# Patient Record
Sex: Female | Born: 2000 | Race: White | Hispanic: No | Marital: Single | Smoking: Never smoker
Health system: Southern US, Community
[De-identification: ages and names within clinical notes are randomized; demographics above are authoritative.]

## PROBLEM LIST (undated history)

## (undated) ENCOUNTER — Ambulatory Visit: Admission: EM | Payer: Medicaid Other

## (undated) ENCOUNTER — Inpatient Hospital Stay: Payer: Self-pay

## (undated) DIAGNOSIS — K589 Irritable bowel syndrome without diarrhea: Secondary | ICD-10-CM

## (undated) DIAGNOSIS — A749 Chlamydial infection, unspecified: Secondary | ICD-10-CM

## (undated) DIAGNOSIS — N83209 Unspecified ovarian cyst, unspecified side: Secondary | ICD-10-CM

## (undated) HISTORY — PX: WISDOM TOOTH EXTRACTION: SHX21

## (undated) HISTORY — DX: Irritable bowel syndrome, unspecified: K58.9

## (undated) HISTORY — DX: Chlamydial infection, unspecified: A74.9

## (undated) HISTORY — DX: Unspecified ovarian cyst, unspecified side: N83.209

---
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
## 2018-01-14 ENCOUNTER — Encounter: Payer: Medicaid Other | Admitting: Maternal Newborn

## 2018-01-15 ENCOUNTER — Encounter: Payer: Medicaid Other | Admitting: Obstetrics and Gynecology

## 2018-01-22 ENCOUNTER — Ambulatory Visit (INDEPENDENT_AMBULATORY_CARE_PROVIDER_SITE_OTHER): Payer: Medicaid Other | Admitting: Advanced Practice Midwife

## 2018-01-22 ENCOUNTER — Encounter: Payer: Self-pay | Admitting: Advanced Practice Midwife

## 2018-01-22 VITALS — BP 110/60 | Wt 169.0 lb

## 2018-01-22 DIAGNOSIS — F418 Other specified anxiety disorders: Secondary | ICD-10-CM

## 2018-01-22 DIAGNOSIS — O99342 Other mental disorders complicating pregnancy, second trimester: Secondary | ICD-10-CM

## 2018-01-22 DIAGNOSIS — Z3A22 22 weeks gestation of pregnancy: Secondary | ICD-10-CM

## 2018-01-22 LAB — POCT URINALYSIS DIPSTICK OB
Glucose, UA: NEGATIVE
POC,PROTEIN,UA: NEGATIVE

## 2018-01-22 NOTE — Progress Notes (Signed)
ROB- no concerns 

## 2018-01-22 NOTE — Progress Notes (Signed)
Routine Prenatal Care Visit  Subjective  Janet Green is a 17 y.o. G1P0000 at [redacted]w[redacted]d being seen today for ongoing prenatal care.  She is currently monitored for the following issues for this low-risk pregnancy and has Amenorrhea following discontinuation of oral contraceptive use; Bacterial vaginosis; Left lower quadrant pain; Supervision of normal first pregnancy, antepartum; Depression affecting pregnancy; and Anxiety during pregnancy on their problem list.  ----------------------------------------------------------------------------------- Patient reports she stopped taking zoloft because she did not think it was helping her. She is feeling better as circumstances have changed. She is having pain in the skin of her legs and pain in her ribs with difficulty getting comfortable when sleeping. Recommended regular exercise, stretching, epsom salt baths, stay well hydrated.   Contractions: Not present. Vag. Bleeding: Scant.  Movement: Present. Denies leaking of fluid.  ----------------------------------------------------------------------------------- The following portions of the patient's history were reviewed and updated as appropriate: allergies, current medications, past family history, past medical history, past social history, past surgical history and problem list. Problem list updated.   Objective  Blood pressure 110/60, weight 169 lb (76.7 kg), last menstrual period 04/16/2017. Pregravid weight 140 lb (63.5 kg) Total Weight Gain 29 lb (13.2 kg) Urinalysis: Urine Protein Negative  Urine Glucose Negative  Fetal Status: Fetal Heart Rate (bpm): 158 Fundal Height: 23 cm Movement: Present     General:  Alert, oriented and cooperative. Patient is in no acute distress.  Skin: Skin is warm and dry. No rash noted.   Cardiovascular: Normal heart rate noted  Respiratory: Normal respiratory effort, no problems with respiration noted  Abdomen: Soft, gravid, appropriate for gestational age.  Pain/Pressure: Absent     Pelvic:  Cervical exam deferred        Extremities: Normal range of motion.  Edema: None  Mental Status: Normal mood and affect. Normal behavior. Normal judgment and thought content.   Assessment   17 y.o. G1P0000 at [redacted]w[redacted]d by  05/23/2018, by Ultrasound presenting for routine prenatal visit  Plan   Pregnancy #1 Problems (from 10/08/17 to present)    Problem Noted Resolved   Depression affecting pregnancy 12/18/2017 by Conard Novak, MD No   Anxiety during pregnancy 12/18/2017 by Conard Novak, MD No   Supervision of normal first pregnancy, antepartum 10/08/2017 by Conard Novak, MD No   Overview Addendum 12/12/2017  5:35 PM by Natale Milch, MD      [facility]inic [facility] Prenatal Labs  Dating 7 wk [place] Blood type: AB/Positive/-- (08/16 1212)   Genetic Screen  NIPS: normal XX   Antibody:Negative (08/16 1212)  Anatomic [place]  Rubella: 0.97 (08/16 1212) Nonimmune Varicella: Nonimmune  GTT Early:        28 wk:      RPR: Non Reactive (08/16 1212)   Rhogam  not applicable HBsAg: Negative (08/16 1212)   TDaP vaccine                       HIV: Non Reactive (08/16 1212)   Flu Shot  Declines                              GBS:   Contraception  Pap: TOO YOUNG  CBB     CS/VBAC  not applicable   Baby Food  Breast   Support Person    Patient identifies as white              Preterm labor symptoms  and general obstetric precautions including but not limited to vaginal bleeding, contractions, leaking of fluid and fetal movement were reviewed in detail with the patient.    Return in about 4 weeks (around 02/19/2018) for rob.  Tresea Mall, CNM 01/22/2018 3:51 PM

## 2018-02-17 ENCOUNTER — Ambulatory Visit (INDEPENDENT_AMBULATORY_CARE_PROVIDER_SITE_OTHER): Payer: Medicaid Other | Admitting: Obstetrics and Gynecology

## 2018-02-17 ENCOUNTER — Encounter: Payer: Self-pay | Admitting: Obstetrics and Gynecology

## 2018-02-17 VITALS — BP 112/70 | Wt 171.0 lb

## 2018-02-17 DIAGNOSIS — F329 Major depressive disorder, single episode, unspecified: Secondary | ICD-10-CM

## 2018-02-17 DIAGNOSIS — Z131 Encounter for screening for diabetes mellitus: Secondary | ICD-10-CM

## 2018-02-17 DIAGNOSIS — O99342 Other mental disorders complicating pregnancy, second trimester: Secondary | ICD-10-CM

## 2018-02-17 DIAGNOSIS — Z3400 Encounter for supervision of normal first pregnancy, unspecified trimester: Secondary | ICD-10-CM

## 2018-02-17 DIAGNOSIS — F32A Depression, unspecified: Secondary | ICD-10-CM

## 2018-02-17 DIAGNOSIS — O99340 Other mental disorders complicating pregnancy, unspecified trimester: Secondary | ICD-10-CM

## 2018-02-17 DIAGNOSIS — Z113 Encounter for screening for infections with a predominantly sexual mode of transmission: Secondary | ICD-10-CM

## 2018-02-17 DIAGNOSIS — Z3A26 26 weeks gestation of pregnancy: Secondary | ICD-10-CM

## 2018-02-17 DIAGNOSIS — F419 Anxiety disorder, unspecified: Secondary | ICD-10-CM

## 2018-02-17 NOTE — Progress Notes (Signed)
Routine Prenatal Care Visit  Subjective  Janet Green is a 17 y.o. G1P0000 at 6731w3d being seen today for ongoing prenatal care.  She is currently monitored for the following issues for this low-risk pregnancy and has Amenorrhea following discontinuation of oral contraceptive use; Bacterial vaginosis; Left lower quadrant pain; Supervision of normal first pregnancy, antepartum; Depression affecting pregnancy; and Anxiety during pregnancy on their problem list.  ----------------------------------------------------------------------------------- Patient reports no complaints.   Contractions: Not present. Vag. Bleeding: None.  Movement: Present. Denies leaking of fluid.  ----------------------------------------------------------------------------------- The following portions of the patient's history were reviewed and updated as appropriate: allergies, current medications, past family history, past medical history, past social history, past surgical history and problem list. Problem list updated.   Objective  Blood pressure 112/70, weight 171 lb (77.6 kg), last menstrual period 04/16/2017. Pregravid weight 140 lb (63.5 kg) Total Weight Gain 31 lb (14.1 kg) Urinalysis: Urine Protein    Urine Glucose    Fetal Status: Fetal Heart Rate (bpm): 145 Fundal Height: 26 cm Movement: Present     General:  Alert, oriented and cooperative. Patient is in no acute distress.  Skin: Skin is warm and dry. No rash noted.   Cardiovascular: Normal heart rate noted  Respiratory: Normal respiratory effort, no problems with respiration noted  Abdomen: Soft, gravid, appropriate for gestational age. Pain/Pressure: Absent     Pelvic:  Cervical exam deferred        Extremities: Normal range of motion.  Edema: None  Mental Status: Normal mood and affect. Normal behavior. Normal judgment and thought content.   Assessment   17 y.o. G1P0000 at 8531w3d by  05/23/2018, by Ultrasound presenting for routine prenatal  visit  Plan   Pregnancy #1 Problems (from 10/08/17 to present)    Problem Noted Resolved   Depression affecting pregnancy 12/18/2017 by Conard NovakJackson, Albertia Carvin D, MD No   Anxiety during pregnancy 12/18/2017 by Conard NovakJackson, Murline Weigel D, MD No   Supervision of normal first pregnancy, antepartum 10/08/2017 by Conard NovakJackson, Zykira Matlack D, MD No   Overview Addendum 12/12/2017  5:35 PM by Natale MilchSchuman, Christanna R, MD      [facility]inic [facility] Prenatal Labs  Dating 7 wk US Blood type: AB/Positive/-- (08/16 1212)   Genetic Screen  NIPS: normal XX   Antibody:Negative (08/16 1212)  Anatomic US  Rubella: 0.97 (08/16 1212) Nonimmune Varicella: Nonimmune  GTT Early:        28 wk:      RPR: Non Reactive (08/16 1212)   Rhogam  not applicable HBsAg: Negative (08/16 1212)   TDaP vaccine                       HIV: Non Reactive (08/16 1212)   Flu Shot  Declines                              GBS:   Contraception  Pap: TOO YOUNG  CBB     CS/VBAC  not applicable   Baby Food  Breast   Support Person    Patient identifies as white              Preterm labor symptoms and general obstetric precautions including but not limited to vaginal bleeding, contractions, leaking of fluid and fetal movement were reviewed in detail with the patient. Please refer to After Visit Summary for other counseling recommendations.   Return in about 2 weeks (around 03/03/2018) for 28 week labs,  routine prenatal.  Thomasene Mohair, MD, [name], [facility] 02/17/2018 4:52 PM

## 2018-03-04 ENCOUNTER — Ambulatory Visit (INDEPENDENT_AMBULATORY_CARE_PROVIDER_SITE_OTHER): Payer: Medicaid Other | Admitting: Obstetrics and Gynecology

## 2018-03-04 ENCOUNTER — Encounter: Payer: Self-pay | Admitting: Obstetrics and Gynecology

## 2018-03-04 ENCOUNTER — Other Ambulatory Visit: Payer: Medicaid Other

## 2018-03-04 VITALS — BP 110/68 | Wt 172.0 lb

## 2018-03-04 DIAGNOSIS — F419 Anxiety disorder, unspecified: Secondary | ICD-10-CM

## 2018-03-04 DIAGNOSIS — Z3400 Encounter for supervision of normal first pregnancy, unspecified trimester: Secondary | ICD-10-CM

## 2018-03-04 DIAGNOSIS — Z113 Encounter for screening for infections with a predominantly sexual mode of transmission: Secondary | ICD-10-CM

## 2018-03-04 DIAGNOSIS — O99340 Other mental disorders complicating pregnancy, unspecified trimester: Secondary | ICD-10-CM

## 2018-03-04 DIAGNOSIS — O99343 Other mental disorders complicating pregnancy, third trimester: Secondary | ICD-10-CM

## 2018-03-04 DIAGNOSIS — F329 Major depressive disorder, single episode, unspecified: Secondary | ICD-10-CM

## 2018-03-04 DIAGNOSIS — F32A Depression, unspecified: Secondary | ICD-10-CM

## 2018-03-04 DIAGNOSIS — Z3A28 28 weeks gestation of pregnancy: Secondary | ICD-10-CM

## 2018-03-04 DIAGNOSIS — Z131 Encounter for screening for diabetes mellitus: Secondary | ICD-10-CM

## 2018-03-04 NOTE — Progress Notes (Signed)
Routine Prenatal Care Visit  Subjective  Janet Green is a 17 y.o. G1P0000 at 425w4d being seen today for ongoing prenatal care.  She is currently monitored for the following issues for this low-risk pregnancy and has Amenorrhea following discontinuation of oral contraceptive use; Bacterial vaginosis; Left lower quadrant pain; Supervision of normal first pregnancy, antepartum; Depression affecting pregnancy; and Anxiety during pregnancy on their problem list.  ----------------------------------------------------------------------------------- Patient reports no complaints.   Contractions: Not present. Vag. Bleeding: None.  Movement: Present. Denies leaking of fluid.  ----------------------------------------------------------------------------------- The following portions of the patient's history were reviewed and updated as appropriate: allergies, current medications, past family history, past medical history, past social history, past surgical history and problem list. Problem list updated.   Objective  Blood pressure 110/68, weight 172 lb (78 kg), last menstrual period 04/16/2017. Pregravid weight 140 lb (63.5 kg) Total Weight Gain 32 lb (14.5 kg) Urinalysis: Urine Protein    Urine Glucose    Fetal Status: Fetal Heart Rate (bpm): 145 Fundal Height: 28 cm Movement: Present     General:  Alert, oriented and cooperative. Patient is in no acute distress.  Skin: Skin is warm and dry. No rash noted.   Cardiovascular: Normal heart rate noted  Respiratory: Normal respiratory effort, no problems with respiration noted  Abdomen: Soft, gravid, appropriate for gestational age. Pain/Pressure: Absent     Pelvic:  Cervical exam deferred        Extremities: Normal range of motion.  Edema: None  Mental Status: Normal mood and affect. Normal behavior. Normal judgment and thought content.   Assessment   17 y.o. G1P0000 at 795w4d by  05/23/2018, by Ultrasound presenting for routine prenatal visit  Plan    Pregnancy #1 Problems (from 10/08/17 to present)    Problem Noted Resolved   Depression affecting pregnancy 12/18/2017 by Conard NovakJackson, Karuna Balducci D, MD No   Anxiety during pregnancy 12/18/2017 by Conard NovakJackson, Ashly Goethe D, MD No   Supervision of normal first pregnancy, antepartum 10/08/2017 by Conard NovakJackson, Balen Woolum D, MD No   Overview Addendum 12/12/2017  5:35 PM by Natale MilchSchuman, Christanna R, MD      [facility]inic [facility] Prenatal Labs  Dating 7 wk US Blood type: AB/Positive/-- (08/16 1212)   Genetic Screen  NIPS: normal XX   Antibody:Negative (08/16 1212)  Anatomic US  Rubella: 0.97 (08/16 1212) Nonimmune Varicella: Nonimmune  GTT Early:        28 wk:      RPR: Non Reactive (08/16 1212)   Rhogam  not applicable HBsAg: Negative (08/16 1212)   TDaP vaccine                       HIV: Non Reactive (08/16 1212)   Flu Shot  Declines                              GBS:   Contraception  Pap: TOO YOUNG  CBB     CS/VBAC  not applicable   Baby Food  Breast   Support Person    Patient identifies as white              Preterm labor symptoms and general obstetric precautions including but not limited to vaginal bleeding, contractions, leaking of fluid and fetal movement were reviewed in detail with the patient. Please refer to After Visit Summary for other counseling recommendations.   - discussed sx of caval compression. Today she describes sx consistent  with caval compression. Instructions given to reduce and minimize symptoms and alleviate them when present. If these measures do not alleviate the sx, she is to contact us immediately for evaluation or go to the ER for syncope (which she did not report).   Return in about 2 weeks (around 03/18/2018) for Routine Prenatal Appointment.  Thomasene Mohair, MD, [name], [facility] 03/04/2018 9:05 AM

## 2018-03-05 LAB — 28 WEEK RH+PANEL
Basophils Absolute: 0.1 10*3/uL (ref 0.0–0.2)
Basos: 0 %
EOS (ABSOLUTE): 0 10*3/uL (ref 0.0–0.4)
Eos: 0 %
Gestational Diabetes Screen: 80 mg/dL (ref 65–139)
HIV Screen 4th Generation wRfx: NONREACTIVE
Hematocrit: 32.3 % — ABNORMAL LOW (ref 34.0–46.6)
Hemoglobin: 10.6 g/dL — ABNORMAL LOW (ref 11.1–15.9)
Immature Grans (Abs): 0.3 10*3/uL — ABNORMAL HIGH (ref 0.0–0.1)
Immature Granulocytes: 2 %
Lymphocytes Absolute: 1.7 10*3/uL (ref 0.7–3.1)
Lymphs: 11 %
MCH: 29.4 pg (ref 26.6–33.0)
MCHC: 32.8 g/dL (ref 31.5–35.7)
MCV: 90 fL (ref 79–97)
Monocytes Absolute: 1 10*3/uL — ABNORMAL HIGH (ref 0.1–0.9)
Monocytes: 7 %
Neutrophils Absolute: 12.2 10*3/uL — ABNORMAL HIGH (ref 1.4–7.0)
Neutrophils: 80 %
Platelets: 282 10*3/uL (ref 150–450)
RBC: 3.6 x10E6/uL — ABNORMAL LOW (ref 3.77–5.28)
RDW: 12.1 % — ABNORMAL LOW (ref 12.3–15.4)
RPR Ser Ql: NONREACTIVE
WBC: 15.3 10*3/uL — ABNORMAL HIGH (ref 3.4–10.8)

## 2018-03-16 ENCOUNTER — Telehealth: Payer: Self-pay

## 2018-03-16 NOTE — Telephone Encounter (Signed)
Pt called c/o getting dizzy, hot, feels like she's going to pass out.  Adv to move slower - stand up slower, sit down slower, do not lock knees while standing - there is more blood to be pumped around.  Also be sure to keep hydrated, eat protein.  Her appt has been moved up to Wed instead of Thurs.

## 2018-03-18 ENCOUNTER — Ambulatory Visit (INDEPENDENT_AMBULATORY_CARE_PROVIDER_SITE_OTHER): Payer: Medicaid Other | Admitting: Obstetrics and Gynecology

## 2018-03-18 ENCOUNTER — Encounter: Payer: Self-pay | Admitting: Obstetrics and Gynecology

## 2018-03-18 VITALS — BP 125/72 | Wt 173.0 lb

## 2018-03-18 DIAGNOSIS — O99343 Other mental disorders complicating pregnancy, third trimester: Secondary | ICD-10-CM

## 2018-03-18 DIAGNOSIS — F329 Major depressive disorder, single episode, unspecified: Secondary | ICD-10-CM

## 2018-03-18 DIAGNOSIS — F32A Depression, unspecified: Secondary | ICD-10-CM

## 2018-03-18 DIAGNOSIS — O99340 Other mental disorders complicating pregnancy, unspecified trimester: Secondary | ICD-10-CM

## 2018-03-18 DIAGNOSIS — Z3400 Encounter for supervision of normal first pregnancy, unspecified trimester: Secondary | ICD-10-CM

## 2018-03-18 DIAGNOSIS — F419 Anxiety disorder, unspecified: Secondary | ICD-10-CM

## 2018-03-18 DIAGNOSIS — Z3A30 30 weeks gestation of pregnancy: Secondary | ICD-10-CM

## 2018-03-18 LAB — POCT URINALYSIS DIPSTICK OB
Glucose, UA: NEGATIVE
POC,PROTEIN,UA: NEGATIVE

## 2018-03-18 NOTE — Progress Notes (Signed)
Routine Prenatal Care Visit  Subjective  Janet Green is a 17 y.o. G1P0000 at 709w4d being seen today for ongoing prenatal care.  She is currently monitored for the following issues for this low-risk pregnancy and has Amenorrhea following discontinuation of oral contraceptive use; Bacterial vaginosis; Left lower quadrant pain; Supervision of normal first pregnancy, antepartum; Depression affecting pregnancy; and Anxiety during pregnancy on their problem list.  ----------------------------------------------------------------------------------- Patient reports continues to have episodes of "blacking out" especially at work.  She states that after standing for some time she will feel like her vision fades and she feels hot and numb. She has tried position changes, but it still takes some time for her to feel better. She does not seem to have these symptoms at home. She did, however, have one episode where she was feeling fine while lying down. When she rolled over she started feeling bad again.  Once she changed her position back to the original, she felt much better.     Contractions: Not present. Vag. Bleeding: None.  Movement: Present. Denies leaking of fluid.  ----------------------------------------------------------------------------------- The following portions of the patient's history were reviewed and updated as appropriate: allergies, current medications, past family history, past medical history, past social history, past surgical history and problem list. Problem list updated.   Objective  Blood pressure 125/72, weight 173 lb (78.5 kg), last menstrual period 04/16/2017. Pregravid weight 140 lb (63.5 kg) Total Weight Gain 33 lb (15 kg) Urinalysis: Urine Protein Negative  Urine Glucose Negative  Fetal Status: Fetal Heart Rate (bpm): 145 Fundal Height: 29 cm Movement: Present     General:  Alert, oriented and cooperative. Patient is in no acute distress.  Skin: Skin is warm and dry. No  rash noted.   Cardiovascular: Normal heart rate noted  Respiratory: Normal respiratory effort, no problems with respiration noted  Abdomen: Soft, gravid, appropriate for gestational age. Pain/Pressure: Absent     Pelvic:  Cervical exam deferred        Extremities: Normal range of motion.  Edema: None  Mental Status: Normal mood and affect. Normal behavior. Normal judgment and thought content.   Assessment   17 y.o. G1P0000 at 589w4d by  05/23/2018, by Ultrasound presenting for routine prenatal visit  Plan   Pregnancy #1 Problems (from 10/08/17 to present)    Problem Noted Resolved   Depression affecting pregnancy 12/18/2017 by Conard NovakJackson, Jiovanny Burdell D, MD No   Anxiety during pregnancy 12/18/2017 by Conard NovakJackson, Kailo Kosik D, MD No   Supervision of normal first pregnancy, antepartum 10/08/2017 by Conard NovakJackson, Kamaal Cast D, MD No   Overview Addendum 12/12/2017  5:35 PM by Natale MilchSchuman, Christanna R, MD      [facility]inic [facility] Prenatal Labs  Dating 7 wk US Blood type: AB/Positive/-- (08/16 1212)   Genetic Screen  NIPS: normal XX   Antibody:Negative (08/16 1212)  Anatomic US  Rubella: 0.97 (08/16 1212) Nonimmune Varicella: Nonimmune  GTT Early:        28 wk:      RPR: Non Reactive (08/16 1212)   Rhogam  not applicable HBsAg: Negative (08/16 1212)   TDaP vaccine                       HIV: Non Reactive (08/16 1212)   Flu Shot  Declines                              GBS:   Contraception  Pap:  TOO YOUNG  CBB     CS/VBAC  not applicable   Baby Food  Breast   Support Person    Patient identifies as white           Preterm labor symptoms and general obstetric precautions including but not limited to vaginal bleeding, contractions, leaking of fluid and fetal movement were reviewed in detail with the patient. Please refer to After Visit Summary for other counseling recommendations.   - Discussed caval compression again. However, I urged her to go to the ER if this happens again because, based on how long her symptoms  last, I can't say for sure this is related to caval compression and she should be evaluated emergently while she has symptoms.  She did note vomiting one time when she tried to lie down in her car.  Note for work excuse today. She returns again to work on Sunday.  If she only has symptoms at work, she may need a not for a longer absence from work.   - Discussed TDaP. She needs to go to the [facility] to have her shot due to her age.   Return in about 2 weeks (around 04/01/2018) for Routine Prenatal Appointment.  Thomasene Mohair, MD, [name], [facility] 03/18/2018 12:27 PM

## 2018-03-19 ENCOUNTER — Encounter: Payer: Medicaid Other | Admitting: Obstetrics and Gynecology

## 2018-04-01 NOTE — L&D Delivery Note (Signed)
Delivery Note Primary OB: [facility] Delivery Physician: Annamarie Major, MD Gestational Age: Full term Antepartum complications: none Intrapartum complications: None  A viable Female was delivered via vertex perentation.  Apgars:9 ,9  Weight:  7 lb 12 oz .   Placenta status: spontaneous and Intact.  Cord: 3+ vessels;  with the following complications: none.  Anesthesia:  epidural Episiotomy:  none Lacerations:  periuretheral Suture Repair: 2.0 vicryl Est. Blood Loss (mL):  less than 100 mL  Mom to postpartum.  Baby to Couplet care / Skin to Skin.  Annamarie Major, MD, [name], [facility] 05/26/2018  6:00 PM [phone]

## 2018-04-02 ENCOUNTER — Encounter: Payer: Medicaid Other | Admitting: Obstetrics and Gynecology

## 2018-04-09 ENCOUNTER — Encounter: Payer: Self-pay | Admitting: Obstetrics and Gynecology

## 2018-04-09 ENCOUNTER — Ambulatory Visit (INDEPENDENT_AMBULATORY_CARE_PROVIDER_SITE_OTHER): Payer: Medicaid Other | Admitting: Obstetrics and Gynecology

## 2018-04-09 VITALS — BP 119/76 | HR 108 | Wt 176.0 lb

## 2018-04-09 DIAGNOSIS — O99340 Other mental disorders complicating pregnancy, unspecified trimester: Secondary | ICD-10-CM

## 2018-04-09 DIAGNOSIS — F32A Depression, unspecified: Secondary | ICD-10-CM

## 2018-04-09 DIAGNOSIS — F419 Anxiety disorder, unspecified: Secondary | ICD-10-CM

## 2018-04-09 DIAGNOSIS — Z3A33 33 weeks gestation of pregnancy: Secondary | ICD-10-CM

## 2018-04-09 DIAGNOSIS — Z3400 Encounter for supervision of normal first pregnancy, unspecified trimester: Secondary | ICD-10-CM

## 2018-04-09 DIAGNOSIS — F329 Major depressive disorder, single episode, unspecified: Secondary | ICD-10-CM

## 2018-04-09 DIAGNOSIS — O99343 Other mental disorders complicating pregnancy, third trimester: Secondary | ICD-10-CM

## 2018-04-09 LAB — POCT URINALYSIS DIPSTICK OB
Glucose, UA: NEGATIVE
POC,PROTEIN,UA: NEGATIVE

## 2018-04-09 NOTE — Progress Notes (Signed)
Routine Prenatal Care Visit  Subjective  Janet Green is a 18 y.o. G1P0000 at 2984w5d being seen today for ongoing prenatal care.  She is currently monitored for the following issues for this low-risk pregnancy and has Amenorrhea following discontinuation of oral contraceptive use; Bacterial vaginosis; Left lower quadrant pain; Supervision of normal first pregnancy, antepartum; Depression affecting pregnancy; and Anxiety during pregnancy on their problem list.  ----------------------------------------------------------------------------------- Patient reports no complaints.   Contractions: Irritability. Vag. Bleeding: None.  Movement: Present. Denies leaking of fluid.  ----------------------------------------------------------------------------------- The following portions of the patient's history were reviewed and updated as appropriate: allergies, current medications, past family history, past medical history, past social history, past surgical history and problem list. Problem list updated.   Objective  Blood pressure 119/76, pulse (!) 108, weight 176 lb (79.8 kg), last menstrual period 04/16/2017. Pregravid weight 140 lb (63.5 kg) Total Weight Gain 36 lb (16.3 kg) Urinalysis: Urine Protein Negative  Urine Glucose Negative  Fetal Status: Fetal Heart Rate (bpm): 145 Fundal Height: 33 cm Movement: Present     General:  Alert, oriented and cooperative. Patient is in no acute distress.  Skin: Skin is warm and dry. No rash noted.   Cardiovascular: Normal heart rate noted  Respiratory: Normal respiratory effort, no problems with respiration noted  Abdomen: Soft, gravid, appropriate for gestational age. Pain/Pressure: Absent     Pelvic:  Cervical exam deferred        Extremities: Normal range of motion.  Edema: None  Mental Status: Normal mood and affect. Normal behavior. Normal judgment and thought content.   Assessment   18 y.o. G1P0000 at 4584w5d by  05/23/2018, by Ultrasound presenting  for routine prenatal visit  Plan   Pregnancy #1 Problems (from 10/08/17 to present)    Problem Noted Resolved   Depression affecting pregnancy 12/18/2017 by Conard NovakJackson, Arlow Spiers D, MD No   Anxiety during pregnancy 12/18/2017 by Conard NovakJackson, Kourtnie Sachs D, MD No   Supervision of normal first pregnancy, antepartum 10/08/2017 by Conard NovakJackson, Czar Ysaguirre D, MD No   Overview Addendum 12/12/2017  5:35 PM by Natale MilchSchuman, Christanna R, MD      Clinic [facility] Prenatal Labs  Dating 7 wk US Blood type: AB/Positive/-- (08/16 1212)   Genetic Screen  NIPS: normal XX   Antibody:Negative (08/16 1212)  Anatomic US  Rubella: 0.97 (08/16 1212) Nonimmune Varicella: Nonimmune  GTT Early:        28 wk:      RPR: Non Reactive (08/16 1212)   Rhogam  not applicable HBsAg: Negative (08/16 1212)   TDaP vaccine                       HIV: Non Reactive (08/16 1212)   Flu Shot  Declines                              GBS:   Contraception  Pap: TOO YOUNG  CBB     CS/VBAC  not applicable   Baby Food  Breast   Support Person    Patient identifies as white              Preterm labor symptoms and general obstetric precautions including but not limited to vaginal bleeding, contractions, leaking of fluid and fetal movement were reviewed in detail with the patient. Please refer to After Visit Summary for other counseling recommendations.   Return in about 2 weeks (around 04/23/2018) for Routine Prenatal Appointment.  Thomasene MohairStephen Maitland Muhlbauer, MD, [facility], [facility] 04/09/2018 3:50 PM

## 2018-04-27 ENCOUNTER — Encounter: Payer: Self-pay | Admitting: Obstetrics and Gynecology

## 2018-04-27 ENCOUNTER — Ambulatory Visit (INDEPENDENT_AMBULATORY_CARE_PROVIDER_SITE_OTHER): Payer: Medicaid Other | Admitting: Obstetrics and Gynecology

## 2018-04-27 ENCOUNTER — Other Ambulatory Visit (HOSPITAL_COMMUNITY)
Admission: RE | Admit: 2018-04-27 | Discharge: 2018-04-27 | Disposition: A | Discharge status: Home / Self Care | Payer: Medicaid Other | Source: Ambulatory Visit | Attending: Obstetrics and Gynecology | Admitting: Obstetrics and Gynecology

## 2018-04-27 VITALS — BP 118/78 | Wt 178.0 lb

## 2018-04-27 DIAGNOSIS — Z3400 Encounter for supervision of normal first pregnancy, unspecified trimester: Secondary | ICD-10-CM | POA: Insufficient documentation

## 2018-04-27 DIAGNOSIS — O99343 Other mental disorders complicating pregnancy, third trimester: Secondary | ICD-10-CM

## 2018-04-27 DIAGNOSIS — F32A Depression, unspecified: Secondary | ICD-10-CM

## 2018-04-27 DIAGNOSIS — F419 Anxiety disorder, unspecified: Secondary | ICD-10-CM

## 2018-04-27 DIAGNOSIS — F329 Major depressive disorder, single episode, unspecified: Secondary | ICD-10-CM

## 2018-04-27 DIAGNOSIS — Z3A36 36 weeks gestation of pregnancy: Secondary | ICD-10-CM | POA: Diagnosis present

## 2018-04-27 DIAGNOSIS — O99340 Other mental disorders complicating pregnancy, unspecified trimester: Secondary | ICD-10-CM

## 2018-04-27 NOTE — Progress Notes (Signed)
Routine Prenatal Care Visit  Subjective  Janet Green is a 18 y.o. G1P0000 at 269w2d being seen today for ongoing prenatal care.  She is currently monitored for the following issues for this low-risk pregnancy and has Amenorrhea following discontinuation of oral contraceptive use; Bacterial vaginosis; Left lower quadrant pain; Supervision of normal first pregnancy, antepartum; Depression affecting pregnancy; and Anxiety during pregnancy on their problem list.  ----------------------------------------------------------------------------------- Patient reports no complaints.   Contractions: Not present. Vag. Bleeding: None.  Movement: Present. Denies leaking of fluid.  ----------------------------------------------------------------------------------- The following portions of the patient's history were reviewed and updated as appropriate: allergies, current medications, past family history, past medical history, past social history, past surgical history and problem list. Problem list updated.   Objective  Blood pressure 118/78, weight 178 lb (80.7 kg), last menstrual period 04/16/2017. Pregravid weight 140 lb (63.5 kg) Total Weight Gain 38 lb (17.2 kg) Urinalysis: Urine Protein    Urine Glucose    Fetal Status: Fetal Heart Rate (bpm): 135 Fundal Height: 35 cm Movement: Present     General:  Alert, oriented and cooperative. Patient is in no acute distress.  Skin: Skin is warm and dry. No rash noted.   Cardiovascular: Normal heart rate noted  Respiratory: Normal respiratory effort, no problems with respiration noted  Abdomen: Soft, gravid, appropriate for gestational age. Pain/Pressure: Absent     Pelvic:  Cervical exam deferred        Extremities: Normal range of motion.  Edema: None  Mental Status: Normal mood and affect. Normal behavior. Normal judgment and thought content.   Assessment   18 y.o. G1P0000 at 549w2d by  05/23/2018, by Ultrasound presenting for routine prenatal  visit  Plan   Pregnancy #1 Problems (from 10/08/17 to present)    Problem Noted Resolved   Depression affecting pregnancy 12/18/2017 by Conard NovakJackson, Vivienne Sangiovanni D, MD No   Anxiety during pregnancy 12/18/2017 by Conard NovakJackson, Stryker Veasey D, MD No   Supervision of normal first pregnancy, antepartum 10/08/2017 by Conard NovakJackson, Gaspard Isbell D, MD No   Overview Addendum 12/12/2017  5:35 PM by Natale MilchSchuman, Christanna R, MD      Clinic [facility] Prenatal Labs  Dating 7 wk US Blood type: AB/Positive/-- (08/16 1212)   Genetic Screen  NIPS: normal XX   Antibody:Negative (08/16 1212)  Anatomic US  Rubella: 0.97 (08/16 1212) Nonimmune Varicella: Nonimmune  GTT Early:        28 wk:      RPR: Non Reactive (08/16 1212)   Rhogam  not applicable HBsAg: Negative (08/16 1212)   TDaP vaccine                       HIV: Non Reactive (08/16 1212)   Flu Shot  Declines                              GBS:   Contraception  Pap: TOO YOUNG  CBB     CS/VBAC  not applicable   Baby Food  Breast   Support Person    Patient identifies as white              Preterm labor symptoms and general obstetric precautions including but not limited to vaginal bleeding, contractions, leaking of fluid and fetal movement were reviewed in detail with the patient. Please refer to After Visit Summary for other counseling recommendations.   -GBS/Aptima today  Return in about 1 week (around 05/04/2018).  Thomasene MohairStephen Maxim Bedel, MD, Merlinda FrederickFACOG [facility], [facility] 04/27/2018 11:13 AM

## 2018-04-28 LAB — CERVICOVAGINAL ANCILLARY ONLY
Chlamydia: NEGATIVE
Neisseria Gonorrhea: NEGATIVE

## 2018-04-29 LAB — STREP GP B NAA: Strep Gp B NAA: NEGATIVE

## 2018-05-03 ENCOUNTER — Observation Stay
Admission: EM | Admit: 2018-05-03 | Discharge: 2018-05-03 | Disposition: A | Discharge status: Home / Self Care | Payer: Medicaid Other | Attending: Obstetrics & Gynecology | Admitting: Obstetrics & Gynecology

## 2018-05-03 ENCOUNTER — Other Ambulatory Visit: Payer: Self-pay

## 2018-05-03 DIAGNOSIS — O26893 Other specified pregnancy related conditions, third trimester: Secondary | ICD-10-CM | POA: Diagnosis present

## 2018-05-03 DIAGNOSIS — M545 Low back pain, unspecified: Secondary | ICD-10-CM | POA: Diagnosis present

## 2018-05-03 DIAGNOSIS — Z3A37 37 weeks gestation of pregnancy: Secondary | ICD-10-CM | POA: Insufficient documentation

## 2018-05-03 DIAGNOSIS — R898 Other abnormal findings in specimens from other organs, systems and tissues: Secondary | ICD-10-CM

## 2018-05-03 DIAGNOSIS — O99343 Other mental disorders complicating pregnancy, third trimester: Secondary | ICD-10-CM | POA: Diagnosis not present

## 2018-05-03 DIAGNOSIS — F329 Major depressive disorder, single episode, unspecified: Secondary | ICD-10-CM | POA: Insufficient documentation

## 2018-05-03 LAB — URINALYSIS, COMPLETE (UACMP) WITH MICROSCOPIC
Bacteria, UA: NONE SEEN
Bilirubin Urine: NEGATIVE
Glucose, UA: NEGATIVE mg/dL
Hgb urine dipstick: NEGATIVE
Ketones, ur: NEGATIVE mg/dL
Leukocytes, UA: NEGATIVE
Nitrite: NEGATIVE
Protein, ur: NEGATIVE mg/dL
Specific Gravity, Urine: 1.014 (ref 1.005–1.030)
pH: 7 (ref 5.0–8.0)

## 2018-05-03 MED ORDER — LACTATED RINGERS IV SOLN: 500.0000 mL | INTRAVENOUS | Status: DC | PRN

## 2018-05-03 MED ORDER — ACETAMINOPHEN 325 MG PO TABS: 650.0000 mg | ORAL_TABLET | ORAL | Status: DC | PRN

## 2018-05-03 MED ORDER — OXYTOCIN BOLUS FROM INFUSION: 500.0000 mL | Freq: Once | INTRAVENOUS | Status: DC

## 2018-05-03 MED ORDER — LACTATED RINGERS IV SOLN: INTRAVENOUS | Status: DC

## 2018-05-03 MED ORDER — OXYTOCIN 40 UNITS IN NORMAL SALINE INFUSION - SIMPLE MED: 2.5000 [IU]/h | INTRAVENOUS | Status: DC

## 2018-05-03 MED ORDER — ONDANSETRON HCL 4 MG/2ML IJ SOLN: 4.0000 mg | Freq: Four times a day (QID) | INTRAMUSCULAR | Status: DC | PRN

## 2018-05-03 NOTE — Discharge Instructions (Signed)
Back Pain in Pregnancy  Back pain during pregnancy is common. Back pain may be caused by several factors that are related to changes during your pregnancy.  Follow these instructions at home:  Managing pain, stiffness, and swelling          If directed, for sudden (acute) back pain, put ice on the painful area.  ? Put ice in a plastic bag.  ? Place a towel between your skin and the bag.  ? Leave the ice on for 20 minutes, 2-3 times per day.   If directed, apply heat to the affected area before you exercise. Use the heat source that your health care provider recommends, such as a moist heat pack or a heating pad.  ? Place a towel between your skin and the heat source.  ? Leave the heat on for 20-30 minutes.  ? Remove the heat if your skin turns bright red. This is especially important if you are unable to feel pain, heat, or cold. You may have a greater risk of getting burned.   If directed, massage the affected area.  Activity   Exercise as told by your health care provider. Gentle exercise is the best way to prevent or manage back pain.   Listen to your body when lifting. If lifting hurts, ask for help or bend your knees. This uses your leg muscles instead of your back muscles.   Squat down when picking up something from the floor. Do not bend over.   Only use bed rest for short periods as told by your health care provider. Bed rest should only be used for the most severe episodes of back pain.  Standing, sitting, and lying down   Do not stand in one place for long periods of time.   Use good posture when sitting. Make sure your head rests over your shoulders and is not hanging forward. Use a pillow on your lower back if necessary.   Try sleeping on your side, preferably the left side, with a pregnancy support pillow or 1-2 regular pillows between your legs.  ? If you have back pain after a night's rest, your bed may be too soft.  ? A firm mattress may provide more support for your back during  pregnancy.  General instructions   Do not wear high heels.   Eat a healthy diet. Try to gain weight within your health care provider's recommendations.   Use a maternity girdle, elastic sling, or back brace as told by your health care provider.   Take over-the-counter and prescription medicines only as told by your health care provider.   Work with a physical therapist or massage therapist to find ways to manage back pain. Acupuncture or massage therapy may be helpful.   Keep all follow-up visits as told by your health care provider. This is important.  Contact a health care provider if:   Your back pain interferes with your daily activities.   You have increasing pain in other parts of your body.  Get help right away if:   You develop numbness, tingling, weakness, or problems with the use of your arms or legs.   You develop severe back pain that is not controlled with medicine.   You have a change in bowel or bladder control.   You develop shortness of breath, dizziness, or you faint.   You develop nausea, vomiting, or sweating.   You have back pain that is a rhythmic, cramping pain similar to labor pains. Labor   pain is usually 1-2 minutes apart, lasts for about 1 minute, and involves a bearing down feeling or pressure in your pelvis.   You have back pain and your water breaks or you have vaginal bleeding.   You have back pain or numbness that travels down your leg.   Your back pain developed after you fell.   You develop pain on one side of your back.   You see blood in your urine.   You develop skin blisters in the area of your back pain.  Summary   Back pain may be caused by several factors that are related to changes during your pregnancy.   Follow instructions as told by your health care provider for managing pain, stiffness, and swelling.   Exercise as told by your health care provider. Gentle exercise is the best way to prevent or manage back pain.   Take over-the-counter and  prescription medicines only as told by your health care provider.   Keep all follow-up visits as told by your health care provider. This is important.  This information is not intended to replace advice given to you by your health care provider. Make sure you discuss any questions you have with your health care provider.  Document Released: 06/26/2005 Document Revised: 09/03/2017 Document Reviewed: 09/03/2017  Elsevier Interactive Patient Education  2019 [employer]

## 2018-05-03 NOTE — Final Progress Note (Signed)
Physician Final Progress Note  Patient ID: Janet Green MRN: [id number] DOB/AGE: 2000/04/27 18 y.o.  Admit date: 05/03/2018 Admitting provider: Nadara Mustard, MD Discharge date: 05/03/2018  Admission Diagnoses: Low Back Pain 20 weeks Pregnancy Leakage of fluid  Discharge Diagnoses:  Principal Problem:   Low back pain   20 weeks pregnancy   Leakage of fluid  Consults: None  Significant Findings/ Diagnostic Studies:  Obstetrics Admission History & Physical   CC: Low back pain and leakage of fluid  HPI:  18 y.o. G1P0000 @ [redacted]w[redacted]d (05/23/2018, by Ultrasound). Admitted on 05/03/2018:   Patient Active Problem List   Diagnosis Date Noted  . Low back pain 05/03/2018  . 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 Anxiety during pregnancy 12/18/2017  . Supervision of normal first pregnancy, antepartum 10/08/2017  . Left lower quadrant pain 09/22/2017  . Bacterial vaginosis 03/06/2017  . Amenorrhea following discontinuation of oral contraceptive use 02/06/2017     Presents for leakage of fluid along with low back pain.  Leakage noted at 1200 today, continuous, clear, odorless.   Prenatal care at: at [facility]. Pregnancy complicated by none.  ROS: A review of systems was performed and negative, except as stated in the above HPI. PMHx:  Past Medical History:  Diagnosis Date  . IBS (irritable bowel syndrome)   . 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 Ruptured ovarian cyst 2019   PSHx:  Past Surgical History:  Procedure Laterality Date  . WISDOM TOOTH EXTRACTION     Medications:  Medications Prior to Admission  Medication Sig Dispense Refill Last Dose  . sertraline (ZOLOFT) 50 MG tablet Take 1 tablet (50 mg total) by mouth daily. 30 tablet 0 05/02/2018   Allergies: has No Known Allergies. OBHx:  OB History  Gravida Para Term Preterm AB Living  1 0 0 0 0 0  SAB TAB Ectopic Multiple Live Births  0 0 0 0 0    # Outcome Date GA Lbr Len/2nd Weight Sex Delivery Anes PTL Lv  1  Current            DVV:OHYWVPXT/GGYIRSWNIOEV except as detailed in HPI.Marland Kitchen  No family history of birth defects. Soc Hx: Alcohol: none and Recreational drug use: none  Objective:   Vitals:   05/03/18 1349  BP: 106/71  Resp: 20  Temp: 97.8 F (36.6 C)   Constitutional: Well nourished, well developed female in no acute distress.  HEENT: normal Skin: Warm and dry.  Cardiovascular:Regular rate and rhythm.   Extremity: trace to 1+ bilateral pedal edema Respiratory: Clear to auscultation bilateral. Normal respiratory effort Abdomen: gravid NT ND Back: no CVAT Neuro: DTRs 2+, Cranial nerves grossly intact Psych: Alert and Oriented x3. No memory deficits. Normal mood and affect.  MS: normal gait, normal bilateral lower extremity ROM/strength/stability.  Pelvic exam: is not limited by body habitus EGBUS: within normal limits Vagina: within normal limits and with normal mucosa and no blood or fluid in the vault Cervix: closed Uterus: No contractions observed for 30 minutes.  Adnexa: not evaluated  EFM:FHR: 140 bpm, variability: moderate,  accelerations:  Present,  decelerations:  Absent Toco: None  MICRO- FERN NEG  Perinatal info:  Blood type: AB positive Rubella- Not immune Varicella -Not immune TDaP tetanus re-vaccination not indicated RPR NR / HIV Neg/ HBsAg Neg   Assessment & Plan:   18 y.o. G1P0000 @ [redacted]w[redacted]d, Admitted on 05/03/2018:   Low Back Pain. No s/sx PROM, or labor Monitor sx's, follow up office tomorrow Heat for low  back pain  Procedures: A NST procedure was performed with FHR monitoring and a normal baseline established, appropriate time of 20-40 minutes of evaluation, and accels >2 seen w 15x15 characteristics.  Results show a REACTIVE NST.   Discharge Condition: good  Disposition: Discharge disposition: 01-Home or Self Care       Diet: Regular diet  Discharge Activity: Activity as tolerated  Discharge Instructions    Call MD for:   Complete by:  As  directed    Worsening contractions or pain; leakage of fluid; bleeding.   Diet general   Complete by:  As directed    Increase activity slowly   Complete by:  As directed      Allergies as of 05/03/2018   No Known Allergies     Medication List    TAKE these medications   sertraline 50 MG tablet Commonly known as:  ZOLOFT Take 1 tablet (50 mg total) by mouth daily.      Follow-up Information    Nadara MustardHarris, Zyrah Wiswell P, MD Follow up.   Specialty:  Obstetrics and Gynecology Why:  Prenatal follow up visit Contact information: [address] [place] [id number] [phone]           Total time spent taking care of this patient: 15 minutes  Signed: Letitia Libraobert Paul Jahi Roza 05/03/2018, 2:15 PM

## 2018-05-03 NOTE — Discharge Summary (Signed)
  See FPN 

## 2018-05-03 NOTE — OB Triage Note (Signed)
Pt reports a gush of fluid at noon today and it continues to leak.

## 2018-05-06 ENCOUNTER — Encounter: Payer: Self-pay | Admitting: Obstetrics and Gynecology

## 2018-05-06 ENCOUNTER — Ambulatory Visit (INDEPENDENT_AMBULATORY_CARE_PROVIDER_SITE_OTHER): Payer: Medicaid Other | Admitting: Obstetrics and Gynecology

## 2018-05-06 VITALS — BP 118/76 | Wt 176.0 lb

## 2018-05-06 DIAGNOSIS — Z3400 Encounter for supervision of normal first pregnancy, unspecified trimester: Secondary | ICD-10-CM

## 2018-05-06 DIAGNOSIS — O99340 Other mental disorders complicating pregnancy, unspecified trimester: Secondary | ICD-10-CM

## 2018-05-06 DIAGNOSIS — F32A Depression, unspecified: Secondary | ICD-10-CM

## 2018-05-06 DIAGNOSIS — Z3A37 37 weeks gestation of pregnancy: Secondary | ICD-10-CM

## 2018-05-06 DIAGNOSIS — F419 Anxiety disorder, unspecified: Secondary | ICD-10-CM

## 2018-05-06 DIAGNOSIS — F329 Major depressive disorder, single episode, unspecified: Secondary | ICD-10-CM

## 2018-05-06 DIAGNOSIS — O99343 Other mental disorders complicating pregnancy, third trimester: Secondary | ICD-10-CM

## 2018-05-06 NOTE — Progress Notes (Signed)
Routine Prenatal Care Visit  Subjective  Janet Green is a 18 y.o. G1P0000 at [redacted]w[redacted]d being seen today for ongoing prenatal care.  She is currently monitored for the following issues for this low-risk pregnancy and has Amenorrhea following discontinuation of oral contraceptive use; Bacterial vaginosis; Left lower quadrant pain; Supervision of normal first pregnancy, antepartum; Depression affecting pregnancy; Anxiety during pregnancy; and Low back pain on their problem list.  ----------------------------------------------------------------------------------- Patient reports no complaints.   Contractions: Not present. Vag. Bleeding: None.  Movement: Present. Denies leaking of fluid.  Went to hospital over weekend because she thought she had ROM. However, workup was negative and she has had no continued leakage. ----------------------------------------------------------------------------------- The following portions of the patient's history were reviewed and updated as appropriate: allergies, current medications, past family history, past medical history, past social history, past surgical history and problem list. Problem list updated.   Objective  Blood pressure 118/76, weight 176 lb (79.8 kg), last menstrual period 04/16/2017. Pregravid weight 140 lb (63.5 kg) Total Weight Gain 36 lb (16.3 kg) Urinalysis: Urine Protein    Urine Glucose    Fetal Status: Fetal Heart Rate (bpm): 145 Fundal Height: 38 cm Movement: Present  Presentation: Vertex  General:  Alert, oriented and cooperative. Patient is in no acute distress.  Skin: Skin is warm and dry. No rash noted.   Cardiovascular: Normal heart rate noted  Respiratory: Normal respiratory effort, no problems with respiration noted  Abdomen: Soft, gravid, appropriate for gestational age. Pain/Pressure: Absent     Pelvic:  Cervical exam deferred        Extremities: Normal range of motion.  Edema: None  Mental Status: Normal mood and affect.  Normal behavior. Normal judgment and thought content.   BSUS today: SLIUP in cephalic presentation, MVP 2.8 cm.  Assessment   18 y.o. G1P0000 at [redacted]w[redacted]d by  05/23/2018, by Ultrasound presenting for routine prenatal visit  Plan   Pregnancy #1 Problems (from 10/08/17 to present)    Problem Noted Resolved   Depression affecting pregnancy 12/18/2017 by Conard Novak, MD No   Anxiety during pregnancy 12/18/2017 by Conard Novak, MD No   Supervision of normal first pregnancy, antepartum 10/08/2017 by Conard Novak, MD No   Overview Addendum 12/12/2017  5:35 PM by Natale Milch, MD      Clinic [facility] Prenatal Labs  Dating 7 wk [place] Blood type: AB/Positive/-- (08/16 1212)   Genetic Screen  NIPS: normal XX   Antibody:Negative (08/16 1212)  Anatomic [place]  Rubella: 0.97 (08/16 1212) Nonimmune Varicella: Nonimmune  GTT Early:        28 wk:      RPR: Non Reactive (08/16 1212)   Rhogam  not applicable HBsAg: Negative (08/16 1212)   TDaP vaccine                       HIV: Non Reactive (08/16 1212)   Flu Shot  Declines                              GBS:   Contraception  Pap: TOO YOUNG  CBB     CS/VBAC  not applicable   Baby Food  Breast   Support Person    Patient identifies as white              Term labor symptoms and general obstetric precautions including but not limited to vaginal bleeding, contractions, leaking  of fluid and fetal movement were reviewed in detail with the patient. Please refer to After Visit Summary for other counseling recommendations.   - AFI next week given borderline amount on exam today.   Return in about 1 week (around 05/13/2018) for u/s for AFI and Routine Prenatal Appointment.  Thomasene MohairStephen Eryka Dolinger, MD, Merlinda FrederickFACOG [facility], [facility] 05/06/2018 1:09 PM

## 2018-05-15 ENCOUNTER — Encounter: Payer: Medicaid Other | Admitting: Obstetrics and Gynecology

## 2018-05-15 ENCOUNTER — Ambulatory Visit (INDEPENDENT_AMBULATORY_CARE_PROVIDER_SITE_OTHER): Payer: Medicaid Other | Admitting: Certified Nurse Midwife

## 2018-05-15 ENCOUNTER — Ambulatory Visit (INDEPENDENT_AMBULATORY_CARE_PROVIDER_SITE_OTHER): Payer: Medicaid Other

## 2018-05-15 VITALS — BP 100/52 | Wt 172.0 lb

## 2018-05-15 DIAGNOSIS — Z3A38 38 weeks gestation of pregnancy: Secondary | ICD-10-CM

## 2018-05-15 DIAGNOSIS — Z3689 Encounter for other specified antenatal screening: Secondary | ICD-10-CM | POA: Diagnosis not present

## 2018-05-15 DIAGNOSIS — Z3400 Encounter for supervision of normal first pregnancy, unspecified trimester: Secondary | ICD-10-CM

## 2018-05-15 DIAGNOSIS — Z3403 Encounter for supervision of normal first pregnancy, third trimester: Secondary | ICD-10-CM

## 2018-05-15 LAB — POCT URINALYSIS DIPSTICK OB: Glucose, UA: NEGATIVE

## 2018-05-15 NOTE — Progress Notes (Signed)
ROB and U/S AFI- no concerns

## 2018-05-16 NOTE — Progress Notes (Signed)
ROB at 38wk6d: Had AFI and it was 9.7cm/ cephalic presentation Having some mild BH contractions and more pelvic pressure. Baby active.  Wants cervical exam FHTs WNL Cervix: closed/ posterior/ 50%/-1 to 0 Labor precautions Discussed birth control options-given handouts.  Farrel Conners, CNM

## 2018-05-21 ENCOUNTER — Ambulatory Visit (INDEPENDENT_AMBULATORY_CARE_PROVIDER_SITE_OTHER): Payer: Medicaid Other | Admitting: Certified Nurse Midwife

## 2018-05-21 VITALS — BP 114/70 | Wt 174.0 lb

## 2018-05-21 DIAGNOSIS — Z3400 Encounter for supervision of normal first pregnancy, unspecified trimester: Secondary | ICD-10-CM

## 2018-05-21 DIAGNOSIS — Z3A39 39 weeks gestation of pregnancy: Secondary | ICD-10-CM

## 2018-05-21 DIAGNOSIS — Z3403 Encounter for supervision of normal first pregnancy, third trimester: Secondary | ICD-10-CM

## 2018-05-21 NOTE — Progress Notes (Signed)
ROB at 39wk5d: Irregular cramping. Drinking red raspberry tea. No vaginal bleeding or leakage of water Good FM. FHR WNL Wants cervical check: 1/75%/-1 and posterior Labor precautions ROB in 1 week Scheduled for IOL at 41 weeks (2/29 at 0800)-discussed why IOL is offered at 41 weeks and risks of continued observation after 41 weeks Explained induction process and use of Cytotec, foley bulb and Pitocin  Farrel Conners, CNM

## 2018-05-21 NOTE — Progress Notes (Signed)
No vb no lof. Some contractions after IC

## 2018-05-26 ENCOUNTER — Other Ambulatory Visit: Payer: Self-pay

## 2018-05-26 ENCOUNTER — Encounter: Payer: Medicaid Other | Admitting: Maternal Newborn

## 2018-05-26 ENCOUNTER — Inpatient Hospital Stay
Admission: EM | Admit: 2018-05-26 | Discharge: 2018-05-28 | DRG: 807 | Disposition: A | Discharge status: Home / Self Care | Payer: Medicaid Other | Attending: Obstetrics & Gynecology | Admitting: Obstetrics & Gynecology

## 2018-05-26 ENCOUNTER — Inpatient Hospital Stay: Payer: Medicaid Other | Admitting: Anesthesiology

## 2018-05-26 DIAGNOSIS — Z3400 Encounter for supervision of normal first pregnancy, unspecified trimester: Secondary | ICD-10-CM

## 2018-05-26 DIAGNOSIS — O26893 Other specified pregnancy related conditions, third trimester: Secondary | ICD-10-CM | POA: Diagnosis present

## 2018-05-26 DIAGNOSIS — Z3A40 40 weeks gestation of pregnancy: Secondary | ICD-10-CM | POA: Diagnosis not present

## 2018-05-26 DIAGNOSIS — F418 Other specified anxiety disorders: Secondary | ICD-10-CM | POA: Diagnosis not present

## 2018-05-26 DIAGNOSIS — O480 Post-term pregnancy: Secondary | ICD-10-CM | POA: Diagnosis not present

## 2018-05-26 DIAGNOSIS — O9902 Anemia complicating childbirth: Principal | ICD-10-CM | POA: Diagnosis present

## 2018-05-26 DIAGNOSIS — F329 Major depressive disorder, single episode, unspecified: Secondary | ICD-10-CM

## 2018-05-26 DIAGNOSIS — D649 Anemia, unspecified: Secondary | ICD-10-CM | POA: Diagnosis present

## 2018-05-26 DIAGNOSIS — O99344 Other mental disorders complicating childbirth: Secondary | ICD-10-CM | POA: Diagnosis not present

## 2018-05-26 DIAGNOSIS — F419 Anxiety disorder, unspecified: Secondary | ICD-10-CM | POA: Diagnosis present

## 2018-05-26 DIAGNOSIS — O99340 Other mental disorders complicating pregnancy, unspecified trimester: Secondary | ICD-10-CM

## 2018-05-26 LAB — CBC
HCT: 34.4 % — ABNORMAL LOW (ref 36.0–46.0)
Hemoglobin: 10.6 g/dL — ABNORMAL LOW (ref 12.0–15.0)
MCH: 24.9 pg — ABNORMAL LOW (ref 26.0–34.0)
MCHC: 30.8 g/dL (ref 30.0–36.0)
MCV: 80.8 fL (ref 80.0–100.0)
Platelets: 250 10*3/uL (ref 150–400)
RBC: 4.26 MIL/uL (ref 3.87–5.11)
RDW: 15.5 % (ref 11.5–15.5)
WBC: 11.8 10*3/uL — ABNORMAL HIGH (ref 4.0–10.5)
nRBC: 0 % (ref 0.0–0.2)

## 2018-05-26 LAB — TYPE AND SCREEN
ABO/RH(D): AB POS
Antibody Screen: NEGATIVE

## 2018-05-26 MED ORDER — BENZOCAINE-MENTHOL 20-0.5 % EX AERO
1.0000 "application " | INHALATION_SPRAY | CUTANEOUS | Status: DC | PRN
  Administered 2018-05-27: 1 via TOPICAL
  Filled 2018-05-26 (×2): qty 56

## 2018-05-26 MED ORDER — ACETAMINOPHEN 500 MG PO TABS
1000.0000 mg | ORAL_TABLET | Freq: Four times a day (QID) | ORAL | Status: AC
  Administered 2018-05-26 – 2018-05-27 (×3): 1000 mg via ORAL
  Filled 2018-05-26 (×3): qty 2

## 2018-05-26 MED ORDER — FENTANYL CITRATE (PF) 100 MCG/2ML IJ SOLN: 50.0000 ug | INTRAMUSCULAR | Status: DC | PRN

## 2018-05-26 MED ORDER — IBUPROFEN 600 MG PO TABS
600.0000 mg | ORAL_TABLET | Freq: Four times a day (QID) | ORAL | Status: DC
  Administered 2018-05-26 – 2018-05-28 (×8): 600 mg via ORAL
  Filled 2018-05-26 (×7): qty 1

## 2018-05-26 MED ORDER — BUTORPHANOL TARTRATE 1 MG/ML IJ SOLN
1.0000 mg | INTRAMUSCULAR | Status: DC | PRN
  Administered 2018-05-26: 1 mg via INTRAVENOUS
  Filled 2018-05-26 (×2): qty 1

## 2018-05-26 MED ORDER — MEASLES, MUMPS & RUBELLA VAC IJ SOLR
0.5000 mL | Freq: Once | INTRAMUSCULAR | Status: DC
  Filled 2018-05-26: qty 0.5

## 2018-05-26 MED ORDER — NALOXONE HCL 0.4 MG/ML IJ SOLN: 0.4000 mg | INTRAMUSCULAR | Status: DC | PRN

## 2018-05-26 MED ORDER — TERBUTALINE SULFATE 1 MG/ML IJ SOLN: 0.2500 mg | Freq: Once | INTRAMUSCULAR | Status: DC | PRN

## 2018-05-26 MED ORDER — BUPIVACAINE HCL (PF) 0.25 % IJ SOLN
INTRAMUSCULAR | Status: DC | PRN
  Administered 2018-05-26: 5 mL via EPIDURAL

## 2018-05-26 MED ORDER — LIDOCAINE HCL (PF) 1 % IJ SOLN: 30.0000 mL | INTRAMUSCULAR | Status: DC | PRN

## 2018-05-26 MED ORDER — DIPHENHYDRAMINE HCL 25 MG PO CAPS: 25.0000 mg | ORAL_CAPSULE | ORAL | Status: DC | PRN

## 2018-05-26 MED ORDER — MISOPROSTOL 200 MCG PO TABS
ORAL_TABLET | ORAL | Status: AC
  Filled 2018-05-26: qty 4

## 2018-05-26 MED ORDER — OXYTOCIN 40 UNITS IN NORMAL SALINE INFUSION - SIMPLE MED
2.5000 [IU]/h | INTRAVENOUS | Status: DC
  Administered 2018-05-26: 2.5 [IU]/h via INTRAVENOUS
  Filled 2018-05-26: qty 1000

## 2018-05-26 MED ORDER — OXYTOCIN 10 UNIT/ML IJ SOLN
INTRAMUSCULAR | Status: AC
  Filled 2018-05-26: qty 2

## 2018-05-26 MED ORDER — LACTATED RINGERS IV SOLN
500.0000 mL | INTRAVENOUS | Status: DC | PRN
  Administered 2018-05-26: 500 mL via INTRAVENOUS

## 2018-05-26 MED ORDER — ONDANSETRON HCL 4 MG/2ML IJ SOLN: 4.0000 mg | Freq: Three times a day (TID) | INTRAMUSCULAR | Status: DC | PRN

## 2018-05-26 MED ORDER — LACTATED RINGERS IV SOLN
INTRAVENOUS | Status: DC
  Administered 2018-05-26 (×2): via INTRAVENOUS

## 2018-05-26 MED ORDER — SODIUM CHLORIDE 0.9% FLUSH: 3.0000 mL | INTRAVENOUS | Status: DC | PRN

## 2018-05-26 MED ORDER — FENTANYL 2.5 MCG/ML W/ROPIVACAINE 0.15% IN NS 100 ML EPIDURAL (ARMC)
EPIDURAL | Status: AC
  Filled 2018-05-26: qty 100

## 2018-05-26 MED ORDER — NALBUPHINE HCL 10 MG/ML IJ SOLN: 5.0000 mg | INTRAMUSCULAR | Status: DC | PRN

## 2018-05-26 MED ORDER — AMMONIA AROMATIC IN INHA
RESPIRATORY_TRACT | Status: AC
  Filled 2018-05-26: qty 10

## 2018-05-26 MED ORDER — KETOROLAC TROMETHAMINE 30 MG/ML IJ SOLN: 30.0000 mg | Freq: Four times a day (QID) | INTRAMUSCULAR | Status: AC | PRN

## 2018-05-26 MED ORDER — DIPHENHYDRAMINE HCL 50 MG/ML IJ SOLN: 12.5000 mg | INTRAMUSCULAR | Status: DC | PRN

## 2018-05-26 MED ORDER — FENTANYL 2.5 MCG/ML W/ROPIVACAINE 0.15% IN NS 100 ML EPIDURAL (ARMC)
EPIDURAL | Status: DC | PRN
  Administered 2018-05-26: 12 mL/h via EPIDURAL

## 2018-05-26 MED ORDER — ACETAMINOPHEN 325 MG PO TABS: 650.0000 mg | ORAL_TABLET | ORAL | Status: DC | PRN

## 2018-05-26 MED ORDER — OXYTOCIN BOLUS FROM INFUSION
500.0000 mL | Freq: Once | INTRAVENOUS | Status: AC
  Administered 2018-05-26: 500 mL via INTRAVENOUS

## 2018-05-26 MED ORDER — IBUPROFEN 600 MG PO TABS
ORAL_TABLET | ORAL | Status: AC
  Filled 2018-05-26: qty 1

## 2018-05-26 MED ORDER — ONDANSETRON HCL 4 MG/2ML IJ SOLN: 4.0000 mg | Freq: Four times a day (QID) | INTRAMUSCULAR | Status: DC | PRN

## 2018-05-26 MED ORDER — VARICELLA VIRUS VACCINE LIVE 1350 PFU/0.5ML IJ SUSR
0.5000 mL | Freq: Once | INTRAMUSCULAR | Status: DC
  Filled 2018-05-26: qty 0.5

## 2018-05-26 MED ORDER — LIDOCAINE HCL (PF) 1 % IJ SOLN
INTRAMUSCULAR | Status: AC
  Filled 2018-05-26: qty 30

## 2018-05-26 MED ORDER — MEPERIDINE HCL 25 MG/ML IJ SOLN: 6.2500 mg | INTRAMUSCULAR | Status: DC | PRN

## 2018-05-26 MED ORDER — OXYTOCIN 40 UNITS IN NORMAL SALINE INFUSION - SIMPLE MED
1.0000 m[IU]/min | INTRAVENOUS | Status: DC
  Administered 2018-05-26: 2 m[IU]/min via INTRAVENOUS

## 2018-05-26 NOTE — Anesthesia Procedure Notes (Signed)
Epidural Patient location during procedure: OB  Staffing Anesthesiologist: Saryna Kneeland, MD Performed: anesthesiologist   Preanesthetic Checklist Completed: patient identified, site marked, surgical consent, pre-op evaluation, timeout performed, IV checked, risks and benefits discussed and monitors and equipment checked  Epidural Patient position: sitting Prep: ChloraPrep Patient monitoring: heart rate, continuous pulse ox and blood pressure Approach: midline Location: L4-L5 Injection technique: LOR saline  Needle:  Needle type: Tuohy  Needle gauge: 18 G Needle length: 9 cm and 9 Catheter type: closed end flexible Catheter size: 20 Guage Test dose: negative and 1.5% lidocaine with Epi 1:200 K  Assessment Sensory level: T10 Events: blood not aspirated, injection not painful, no injection resistance, negative IV test and no paresthesia  Additional Notes   Patient tolerated the insertion well without complications.Reason for block:procedure for pain     

## 2018-05-26 NOTE — Anesthesia Preprocedure Evaluation (Signed)
Anesthesia Evaluation  Patient identified by MRN, date of birth, ID band Patient awake    Reviewed: Allergy & Precautions, NPO status , Patient's Chart, lab work & pertinent test results, reviewed documented beta blocker date and time   Airway Mallampati: II  TM Distance: >3 FB     Dental  (+) Chipped   Pulmonary           Cardiovascular      Neuro/Psych PSYCHIATRIC DISORDERS Depression    GI/Hepatic   Endo/Other    Renal/GU      Musculoskeletal   Abdominal   Peds  Hematology   Anesthesia Other Findings   Reproductive/Obstetrics                             Anesthesia Physical Anesthesia Plan  ASA: II  Anesthesia Plan: Epidural   Post-op Pain Management:    Induction:   PONV Risk Score and Plan:   Airway Management Planned:   Additional Equipment:   Intra-op Plan:   Post-operative Plan:   Informed Consent: I have reviewed the patients History and Physical, chart, labs and discussed the procedure including the risks, benefits and alternatives for the proposed anesthesia with the patient or authorized representative who has indicated his/her understanding and acceptance.       Plan Discussed with: CRNA  Anesthesia Plan Comments:         Anesthesia Quick Evaluation

## 2018-05-26 NOTE — Progress Notes (Signed)
  Labor Progress Note   18 y.o. G1P0000 @ [redacted]w[redacted]d , admitted for  Pregnancy, Labor Management.   Subjective:  Active labor on admission at 4-5 cm w contractions. The contraction pain has lessened now. No VB or ROM  Objective:  BP 109/68 (BP Location: Left Arm)   Pulse (!) 101   Temp 98.1 F (36.7 C) (Oral)   Resp 20   Ht 5\' 7"  (1.702 m)   Wt 81.2 kg   LMP 04/16/2017 (Exact Date)   SpO2 99%   BMI 28.04 kg/m  Abd: gravid ND mild T VTX Extr: trace to 1+ bilateral pedal edema SVE: CERVIX: 5 cm dilated, 80 effaced, -3 station AROM CLEAR  EFM: FHR: 150 bpm, variability: moderate,  accelerations:  Present,  decelerations:  Absent Toco: Frequency: Every 5-7 minutes Labs: I have reviewed the patient's lab results.   Assessment & Plan:  G1P0000 @ [redacted]w[redacted]d, admitted for  Pregnancy and Labor/Delivery Management  1. Pain management: none. 2. FWB: FHT category 1.  3. ID: GBS negative 4. Labor management: AROM now and alllow time for progression of natural labor in active stage; consider Pitocin for active management of labor in next 1-2 hours based on exam and toco  All discussed with patient, see orders  Annamarie Major, MD, [name] Ob/G[facility], [facility] 05/26/2018  12:37 PM

## 2018-05-26 NOTE — H&P (Signed)
Obstetrics Admission History & Physical     HPI:  18 y.o. G1P0000 @ [redacted]w[redacted]d (05/23/2018, by Ultrasound). Admitted on 05/26/2018:   Patient Active Problem List   Diagnosis Date Noted  . Normal labor 05/26/2018  . Low back pain 05/03/2018  . 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 Anxiety during pregnancy 12/18/2017  . Supervision of normal first pregnancy, antepartum 10/08/2017  . Left lower quadrant pain 09/22/2017  . Bacterial vaginosis 03/06/2017  . Amenorrhea following discontinuation of oral contraceptive use 02/06/2017     Presents for contractions that began around 3 am this morning and have been every 5 minutes, rating pain with contractions as 7/10. She has not had any vaginal bleeding or loss of fluid. Her baby is moving well.  Prenatal care at: at [facility]. Pregnancy complicated by anxiety and depression.  ROS: A review of systems was performed and negative, except as stated in the above HPI. PMHx:  Past Medical History:  Diagnosis Date  . IBS (irritable bowel syndrome)   . 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 Ruptured ovarian cyst 2019   PSHx:  Past Surgical History:  Procedure Laterality Date  . WISDOM TOOTH EXTRACTION     Medications:  No medications prior to admission.   Allergies: has No Known Allergies. OBHx:  OB History  Gravida Para Term Preterm AB Living  1 0 0 0 0 0  SAB TAB Ectopic Multiple Live Births  0 0 0 0 0    # Outcome Date GA Lbr Len/2nd Weight Sex Delivery Anes PTL Lv  1 Current            FHx:  Family History  Problem Relation Age of Onset  . Cancer Neg Hx   . Diabetes Neg Hx   . Hypertension Neg Hx   . Stroke Neg Hx   . Thyroid disease Neg Hx    Soc Hx: Never smoker, Alcohol: none and Recreational drug use: none  Objective:   Vitals:   05/26/18 0930  BP: 109/68  Pulse: (!) 101  Resp: 20  Temp: 98.1 F (36.7 C)  SpO2: 99%   Constitutional: Well nourished, well developed female in no acute distress.  HEENT:  normal Skin: Warm and dry.  Cardiovascular: Regular rate and rhythm.   Extremity: trace bilateral pedal edema Respiratory: Clear to auscultation bilaterally. Normal respiratory effort Abdomen: gravid, non-tender, pain with contractions Neuro: Cranial nerves grossly intact Psych: Alert and Oriented x3. No memory deficits. Normal mood and affect.  MS: normal gait, normal bilateral lower extremity ROM/strength/stability.  Cervix: 4.5/90/+1 (RN exam)  EFM:FHR: 140 bpm, variability: moderate,  accelerations:  Present,  decelerations:  Absent Toco: Frequency: Every 3.5-5 minutes, Duration: 50-100 seconds and Intensity: moderate   Perinatal info:  Blood type: AB positive Rubella - Not immune Varicella - Not immune TDaP has not been received during this pregnancy RPR NR / HIV Neg/ HBsAg Neg   Assessment & Plan:   18 y.o. G1P0000 @ [redacted]w[redacted]d, Admitted on 05/26/2018 in labor.    Admit for labor, Observe for cervical change, Fetal Wellbeing Reassuring, Epidural when ready, AROM when Appropriate and GBS status negative, treat as needed  Marcelyn Bruins, CNM [facility], [facility] 05/26/2018

## 2018-05-26 NOTE — Discharge Summary (Signed)
OB Discharge Summary     Patient Name: Janet Green DOB: 2001-03-09 MRN: [id number]  Date of admission: 05/26/2018 Delivering MD: Hoyt Koch, MD  Date of Delivery: 05/26/2018  Date of discharge: 05/28/2018  Admitting diagnosis: 44 weeks;contractions Intrauterine pregnancy: [redacted]w[redacted]d    Secondary diagnosis: None     Discharge diagnosis: Term Pregnancy Delivered, No other diagnosis                         Hospital course:  Onset of Labor With Vaginal Delivery     18y.o. yo G1P0000 at 441w3das admitted in Active Labor on 05/26/2018. Patient had an uncomplicated labor course as follows:  Membrane Rupture Time/Date: 12:26 PM ,05/26/2018   Intrapartum Procedures: Episiotomy: None [1]                                         Lacerations:  Periurethral [8]  Patient had a delivery of a Viable infant. 05/26/2018  Information for the patient's newborn:  MuMalessa, Zartman0[[id number]]Delivery Method: Vag-Spont    Pateint had a postpartum course complicated by strong afterbirth pains/ cramping with breast feeding. Baby has been breast feeding well and frequently. Patient has been taking Motrin every 6 hours and she also needed a roxicodone this AM. Fundus is firm at U-1 and bleeding is appropriate.  She is afebrile, tolerating a regular diet, has had a BM, and is urinating well.  Patient is discharged home in stable condition on 05/28/18.                                                                  Post partum procedures:none, MMR and Varivax refused  Complications: None  Physical exam on 05/28/2018: Vitals:   05/27/18 2318 05/28/18 0800 05/28/18 1252 05/28/18 1548  BP: (!) 90/51 (!) 99/55 113/73 94/61  Pulse: 93 81 99 (!) 108  Resp:  '18 20 18  ' Temp:  99 F (37.2 C) 98.6 F (37 C) 98.5 F (36.9 C)  TempSrc:  Oral Axillary Oral  SpO2:  100% 100% 99%  Weight:      Height:       General: alert, cooperative and mild distress Lochia: appropriate Uterine Fundus: firm/ U-1/mild  tenderness/ML Periurethral laceration:  healing well DVT Evaluation: No evidence of DVT seen on physical exam.  Labs: Lab Results  Component Value Date   WBC 15.2 (H) 05/27/2018   HGB 9.3 (L) 05/27/2018   HCT 31.0 (L) 05/27/2018   MCV 82.0 05/27/2018   PLT 264 05/27/2018   CMP Latest Ref Rng & Units 10/05/2017  Glucose 70 - 99 mg/dL 88  BUN 6 - 20 mg/dL 9  Creatinine 0.44 - 1.00 mg/dL 0.58  Sodium 135 - 145 mmol/L 137  Potassium 3.5 - 5.1 mmol/L 3.5  Chloride 98 - 111 mmol/L 104  CO2 22 - 32 mmol/L 23  Calcium 8.9 - 10.3 mg/dL 9.7  Total Protein 6.5 - 8.1 g/dL -  Total Bilirubin 0.3 - 1.2 mg/dL -  Alkaline Phos 38 - 126 U/L -  AST 15 - 41 U/L -  ALT 14 - 54  U/L -    Discharge instruction: per After Visit Summary.  Medications:  Allergies as of 05/28/2018   No Known Allergies     Medication List    TAKE these medications   acetaminophen 325 MG tablet Commonly known as:  TYLENOL Take 2 tablets (650 mg total) by mouth every 4 (four) hours as needed for mild pain.   docusate sodium 100 MG capsule Commonly known as:  COLACE Take 1 capsule (100 mg total) by mouth daily as needed.   ferrous sulfate 325 (65 FE) MG tablet Take 1 tablet (325 mg total) by mouth daily.   ibuprofen 600 MG tablet Commonly known as:  ADVIL,MOTRIN Take 1 tablet (600 mg total) by mouth every 6 (six) hours as needed.   oxyCODONE 5 MG immediate release tablet Commonly known as:  Oxy IR/ROXICODONE Take 1 tablet (5 mg total) by mouth every 6 (six) hours as needed for up to 5 days for severe pain.       Diet: routine diet  Activity: Advance as tolerated. Pelvic rest for 6 weeks.   Outpatient follow up: Follow-up Information    Gae Dry, MD. Schedule an appointment as soon as possible for a visit in 6 week(s).   Specialty:  Obstetrics and Gynecology Contact information: [address] [place] [address] [phone]        Dalia Heading, CNM. Schedule an  appointment as soon as possible for a visit in 1 week(s).   Specialty:  Certified Nurse Midwife Contact information: [place] [address] [phone]             Postpartum contraception: ?Mirena IUD Rhogam Given postpartum: no Rubella vaccine given postpartum: needed-refused Varicella vaccine given postpartum: needed-refused TDaP given antepartum or postpartum: No  Newborn Data: Live born female  Birth Weight: 7 lb 12.2 oz (3520 g) APGAR: ,   Newborn Delivery   Birth date/time:  05/26/2018 17:43:00 Delivery type:  Vaginal, Spontaneous      Baby Feeding: Breast  Disposition:home with mother  SIGNED:  Dalia Heading, [facility] 05/28/2018 7:32 PM

## 2018-05-26 NOTE — OB Triage Note (Signed)
Pt presents c/o ctx since 3am. Pt states they are 5 mins apart and are lasting between 30-70 seconds. Denies any LOF or bleeding. Last intercourse was today around 2am. Reports positive fetal movement. Vitals WNL. Will continue to monitor.

## 2018-05-27 LAB — CBC
HCT: 31 % — ABNORMAL LOW (ref 36.0–46.0)
Hemoglobin: 9.3 g/dL — ABNORMAL LOW (ref 12.0–15.0)
MCH: 24.6 pg — ABNORMAL LOW (ref 26.0–34.0)
MCHC: 30 g/dL (ref 30.0–36.0)
MCV: 82 fL (ref 80.0–100.0)
Platelets: 264 10*3/uL (ref 150–400)
RBC: 3.78 MIL/uL — ABNORMAL LOW (ref 3.87–5.11)
RDW: 15.3 % (ref 11.5–15.5)
WBC: 15.2 10*3/uL — ABNORMAL HIGH (ref 4.0–10.5)
nRBC: 0 % (ref 0.0–0.2)

## 2018-05-27 LAB — RPR: RPR Ser Ql: NONREACTIVE

## 2018-05-27 MED ORDER — OXYCODONE-ACETAMINOPHEN 5-325 MG PO TABS
1.0000 | ORAL_TABLET | ORAL | Status: DC | PRN
  Administered 2018-05-27: 1 via ORAL
  Filled 2018-05-27: qty 1

## 2018-05-27 MED ORDER — WITCH HAZEL-GLYCERIN EX PADS
1.0000 "application " | MEDICATED_PAD | CUTANEOUS | Status: DC | PRN
  Administered 2018-05-27: 1 via TOPICAL
  Filled 2018-05-27 (×2): qty 100

## 2018-05-27 MED ORDER — SODIUM CHLORIDE 0.9 % IV SOLN: 250.0000 mL | INTRAVENOUS | Status: DC | PRN

## 2018-05-27 MED ORDER — OXYCODONE-ACETAMINOPHEN 5-325 MG PO TABS: 2.0000 | ORAL_TABLET | ORAL | Status: DC | PRN

## 2018-05-27 MED ORDER — ACETAMINOPHEN 325 MG PO TABS
650.0000 mg | ORAL_TABLET | ORAL | Status: DC | PRN
  Administered 2018-05-27 – 2018-05-28 (×3): 650 mg via ORAL
  Filled 2018-05-27 (×4): qty 2

## 2018-05-27 MED ORDER — SODIUM CHLORIDE 0.9% FLUSH: 3.0000 mL | INTRAVENOUS | Status: DC | PRN

## 2018-05-27 MED ORDER — ZOLPIDEM TARTRATE 5 MG PO TABS: 5.0000 mg | ORAL_TABLET | Freq: Every evening | ORAL | Status: DC | PRN

## 2018-05-27 MED ORDER — COCONUT OIL OIL
1.0000 "application " | TOPICAL_OIL | Status: DC | PRN
  Administered 2018-05-28: 1 via TOPICAL
  Filled 2018-05-27: qty 120

## 2018-05-27 MED ORDER — OXYCODONE HCL 5 MG PO TABS
5.0000 mg | ORAL_TABLET | ORAL | Status: AC | PRN
  Administered 2018-05-27 (×3): 5 mg via ORAL
  Filled 2018-05-27 (×3): qty 1

## 2018-05-27 MED ORDER — SODIUM CHLORIDE 0.9% FLUSH: 3.0000 mL | Freq: Two times a day (BID) | INTRAVENOUS | Status: DC

## 2018-05-27 MED ORDER — ONDANSETRON HCL 4 MG/2ML IJ SOLN: 4.0000 mg | INTRAMUSCULAR | Status: DC | PRN

## 2018-05-27 MED ORDER — ONDANSETRON HCL 4 MG PO TABS: 4.0000 mg | ORAL_TABLET | ORAL | Status: DC | PRN

## 2018-05-27 MED ORDER — DIPHENHYDRAMINE HCL 25 MG PO CAPS: 25.0000 mg | ORAL_CAPSULE | Freq: Four times a day (QID) | ORAL | Status: DC | PRN

## 2018-05-27 MED ORDER — SIMETHICONE 80 MG PO CHEW: 80.0000 mg | CHEWABLE_TABLET | ORAL | Status: DC | PRN

## 2018-05-27 MED ORDER — SENNOSIDES-DOCUSATE SODIUM 8.6-50 MG PO TABS
2.0000 | ORAL_TABLET | ORAL | Status: DC
  Administered 2018-05-27 – 2018-05-28 (×2): 2 via ORAL
  Filled 2018-05-27 (×2): qty 2

## 2018-05-27 MED ORDER — DIBUCAINE 1 % RE OINT
1.0000 "application " | TOPICAL_OINTMENT | RECTAL | Status: DC | PRN
  Administered 2018-05-27: 1 via RECTAL
  Filled 2018-05-27: qty 28

## 2018-05-27 NOTE — Progress Notes (Signed)
Patient call this RN into room and c/o severe cramping and was crying.  VSS, fundus is firm with minimal bleeding.  Infant had just finished feeding.  Encouraged pt to empty bladder frequently, use kpad and take meds as ordred.

## 2018-05-27 NOTE — Progress Notes (Signed)
Called to patient room, FOB states he feels like he is going to have a seizure, was awakened from sleep with tremors to arms/hands. He is visibly upset, crying, states he feels nauseous. [facility] notified, transported to ED for evaluation via wheelchair. Pt attempting to contact FOB's parents as he is under 18.

## 2018-05-27 NOTE — Progress Notes (Signed)
Post Partum Day 1  Subjective: Doing well, tired.  Tolerating regular diet, pain moderately managed with PO meds but still has cramping and low back pain.  Voiding and ambulating without difficulty.  Feels like she needs to have BM but is worried about trying.  FOB is at home resting with his family.  Harly's mother here.  No CP SOB F/C N/V or leg pain no HA change of vision, RUQ/epigastric pain  Objective: BP 112/65 (BP Location: Left Arm)   Pulse 78   Temp 98.5 F (36.9 C) (Oral)   Resp 16   Ht 5\' 7"  (1.702 m)   Wt 81.2 kg   LMP 04/16/2017 (Exact Date)   SpO2 100%   Breastfeeding   BMI 28.04 kg/m    Physical Exam:  General: NAD CV: RRR Pulm: nl effort, CTABL Lochia: light Uterine Fundus: fundus firm and below umbilicus DVT Evaluation: no cords, ttp LEs   Recent Labs    05/26/18 1026 05/27/18 0610  HGB 10.6* 9.3*  HCT 34.4* 31.0*  WBC 11.8* 15.2*  PLT 250 264    Intake/Output Summary (Last 24 hours) at 05/27/2018 0910 Last data filed at 05/27/2018 0400 Gross per 24 hour  Intake 1503 ml  Output 2985 ml  Net -1482 ml    Assessment/Plan: 18 y.o. G1P1001 postpartum day # 1, breastfeeding  Plan: 1) Routine PP care with heating pad for pain and Colace for stool softening. 2) AB+/RNI/VZNI/previously declined Tdap: undecided on vaccines before d/c but will consider 3) breastfeeding 4) birth control: desires Mirena PP 5) disposition: likely d/c on PPD2    Seen by Myrtha Mantis RN SNM with supervision by Tresea Mall CNM  Tresea Mall, CNM [facility]

## 2018-05-27 NOTE — Anesthesia Postprocedure Evaluation (Signed)
Anesthesia Post Note  Patient: Janet Green  Procedure(s) Performed: AN AD HOC LABOR EPIDURAL  Patient location during evaluation: Mother Baby Anesthesia Type: Epidural Level of consciousness: oriented and awake and alert Pain management: satisfactory to patient Respiratory status: respiratory function stable Cardiovascular status: stable Postop Assessment: epidural receding, no backache, no headache, patient able to bend at knees, no apparent nausea or vomiting, adequate PO intake and able to ambulate Anesthetic complications: no     Last Vitals:  Vitals:   05/26/18 2337 05/27/18 0326  BP: 111/71 122/81  Pulse: 83 92  Resp: 18 18  Temp: 37 C 36.9 C  SpO2: 98% 100%    Last Pain:  Vitals:   05/27/18 0326  TempSrc: Oral  PainSc:                  Clydene Pugh

## 2018-05-28 ENCOUNTER — Encounter: Payer: Medicaid Other | Admitting: Certified Nurse Midwife

## 2018-05-28 MED ORDER — ACETAMINOPHEN 325 MG PO TABS: 650.0000 mg | ORAL_TABLET | ORAL | Status: DC | PRN

## 2018-05-28 MED ORDER — FERROUS SULFATE 325 (65 FE) MG PO TABS
325.0000 mg | ORAL_TABLET | Freq: Every day | ORAL | Status: DC
  Administered 2018-05-28: 325 mg via ORAL
  Filled 2018-05-28: qty 1

## 2018-05-28 MED ORDER — ONDANSETRON 4 MG PO TBDP
4.0000 mg | ORAL_TABLET | Freq: Four times a day (QID) | ORAL | Status: DC | PRN
  Administered 2018-05-28: 4 mg via ORAL
  Filled 2018-05-28: qty 1

## 2018-05-28 MED ORDER — OXYCODONE HCL 5 MG PO TABS
5.0000 mg | ORAL_TABLET | Freq: Four times a day (QID) | ORAL | Status: DC | PRN
  Administered 2018-05-28 (×2): 5 mg via ORAL
  Filled 2018-05-28: qty 1

## 2018-05-28 MED ORDER — FERROUS SULFATE 325 (65 FE) MG PO TABS: 325.0000 mg | ORAL_TABLET | Freq: Every day | ORAL | 1 refills | Status: DC

## 2018-05-28 MED ORDER — OXYCODONE HCL 5 MG PO TABS: 5.0000 mg | ORAL_TABLET | Freq: Four times a day (QID) | ORAL | 0 refills | Status: AC | PRN

## 2018-05-28 MED ORDER — IBUPROFEN 600 MG PO TABS: 600.0000 mg | ORAL_TABLET | Freq: Four times a day (QID) | ORAL | 1 refills | Status: DC | PRN

## 2018-05-28 MED ORDER — DOCUSATE SODIUM 100 MG PO CAPS: 100.0000 mg | ORAL_CAPSULE | Freq: Every day | ORAL | 2 refills | Status: AC | PRN

## 2018-05-28 MED ORDER — OXYCODONE HCL 5 MG PO TABS
5.0000 mg | ORAL_TABLET | ORAL | Status: DC | PRN
  Filled 2018-05-28: qty 1

## 2018-05-28 NOTE — Progress Notes (Signed)
Pt discharged with infant.  Discharge instructions, prescriptions and follow up appointment given to and reviewed with pt. Pt verbalized understanding. Escorted out by staff. 

## 2018-05-28 NOTE — Progress Notes (Signed)
Post Partum Day 2 Subjective: Baby has been cluster feeding. Mother complaining of cramping and contractions when she breast feeds. Has not really held baby without breast feeding and usually baby lies on a pillow when she breast feeds. Cramping not relievd with Motrin and needed roxicodone this AM. She has not been eating and took this on an empty stomach which caused some nausea. Nausea was relieved with Zofran.   Objective: Blood pressure 113/73, pulse 99, temperature 98.6 F (37 C), temperature source Axillary, resp. rate 20, height 5\' 7"  (1.702 m), weight 81.2 kg, last menstrual period 04/16/2017, SpO2 100 %, unknown if currently breastfeeding.  Physical Exam:  General: alert, cooperative and mild distress Lochia: appropriate Uterine Fundus: firm/ U-1/mild tenderness/ML Periurethral laceration:  healing well DVT Evaluation: No evidence of DVT seen on physical exam.  Recent Labs    05/26/18 1026 05/27/18 0610  HGB 10.6* 9.3*  HCT 34.4* 31.0*  WBC 11.8* 15.2*  PLT 250 264    Assessment/Plan: PPD #2 stable Mild anemia-iron and vitamins Unusual amount of cramping-has been breast feeding for long periods of time and frequently  No evidence of endomyometritis, I.e. fever Talked with patient about not having to breast feed every time baby cries, if she has just breast fed, baby may need to be changed or just want to be held. Cramping from breast feeding should decrease over the next few days Will discharge and have patient follow up early next week at office.  A POS/ RNI/VNI-offer vaccines on discharge Contraception: ?Mirena TDAP-declined  Farrel Conners, CNM      LOS: 2 days   Farrel Conners 05/28/2018, 12:57 PM

## 2018-05-28 NOTE — Progress Notes (Signed)
   05/28/18 1400  Clinical Encounter Type  Visited With Patient and family together  Visit Type Initial  Referral From Chaplain  Consult/Referral To Chaplain  Spiritual Encounters  Spiritual Needs Emotional  Chaplain was rounding and stop to visit patient. Chaplain introduced herself to patient and family. Chaplain congratulated patient on new birth of baby and gave her a cap and prayer shawl. Patient and family were very happy, they thanked the [address]. Chaplain asked was there anything she could do for patient and patient said no thank you. Chaplain gave blessings and left.

## 2018-05-28 NOTE — Discharge Instructions (Signed)
Breastfeeding  Choosing to breastfeed is one of the best decisions you can make for yourself and your baby. A change in hormones during pregnancy causes your breasts to make breast milk in your milk-producing glands. Hormones prevent breast milk from being released before your baby is born. They also prompt milk flow after birth. Once breastfeeding has begun, thoughts of your baby, as well as his or her sucking or crying, can stimulate the release of milk from your milk-producing glands. Benefits of breastfeeding Research shows that breastfeeding offers many health benefits for infants and mothers. It also offers a cost-free and convenient way to feed your baby. For your baby  Your first milk (colostrum) helps your baby's digestive system to function better.  Special cells in your milk (antibodies) help your baby to fight off infections.  Breastfed babies are less likely to develop asthma, allergies, obesity, or type 2 diabetes. They are also at lower risk for sudden infant death syndrome (SIDS).  Nutrients in breast milk are better able to meet your babys needs compared to infant formula.  Breast milk improves your baby's brain development. For you  Breastfeeding helps to create a very special bond between you and your baby.  Breastfeeding is convenient. Breast milk costs nothing and is always available at the correct temperature.  Breastfeeding helps to burn calories. It helps you to lose the weight that you gained during pregnancy.  Breastfeeding makes your uterus return faster to its size before pregnancy. It also slows bleeding (lochia) after you give birth.  Breastfeeding helps to lower your risk of developing type 2 diabetes, osteoporosis, rheumatoid arthritis, cardiovascular disease, and breast, ovarian, uterine, and endometrial cancer later in life. Breastfeeding basics Starting breastfeeding  Find a comfortable place to sit or lie down, with your neck and back  well-supported.  Place a pillow or a rolled-up blanket under your baby to bring him or her to the level of your breast (if you are seated). Nursing pillows are specially designed to help support your arms and your baby while you breastfeed.  Make sure that your baby's tummy (abdomen) is facing your abdomen.  Gently massage your breast. With your fingertips, massage from the outer edges of your breast inward toward the nipple. This encourages milk flow. If your milk flows slowly, you may need to continue this action during the feeding.  Support your breast with 4 fingers underneath and your thumb above your nipple (make the letter "C" with your hand). Make sure your fingers are well away from your nipple and your babys mouth.  Stroke your baby's lips gently with your finger or nipple.  When your baby's mouth is open wide enough, quickly bring your baby to your breast, placing your entire nipple and as much of the areola as possible into your baby's mouth. The areola is the colored area around your nipple. ? More areola should be visible above your baby's upper lip than below the lower lip. ? Your baby's lips should be opened and extended outward (flanged) to ensure an adequate, comfortable latch. ? Your baby's tongue should be between his or her lower gum and your breast.  Make sure that your baby's mouth is correctly positioned around your nipple (latched). Your baby's lips should create a seal on your breast and be turned out (everted).  It is common for your baby to suck about 2-3 minutes in order to start the flow of breast milk. Latching Teaching your baby how to latch onto your breast  properly is very important. An improper latch can cause nipple pain, decreased milk supply, and poor weight gain in your baby. Also, if your baby is not latched onto your nipple properly, he or she may swallow some air during feeding. This can make your baby fussy. Burping your baby when you switch breasts  during the feeding can help to get rid of the air. However, teaching your baby to latch on properly is still the best way to prevent fussiness from swallowing air while breastfeeding. Signs that your baby has successfully latched onto your nipple  Silent tugging or silent sucking, without causing you pain. Infant's lips should be extended outward (flanged).  Swallowing heard between every 3-4 sucks once your milk has started to flow (after your let-down milk reflex occurs).  Muscle movement above and in front of his or her ears while sucking. Signs that your baby has not successfully latched onto your nipple  Sucking sounds or smacking sounds from your baby while breastfeeding.  Nipple pain. If you think your baby has not latched on correctly, slip your finger into the corner of your babys mouth to break the suction and place it between your baby's gums. Attempt to start breastfeeding again. Signs of successful breastfeeding Signs from your baby  Your baby will gradually decrease the number of sucks or will completely stop sucking.  Your baby will fall asleep.  Your baby's body will relax.  Your baby will retain a small amount of milk in his or her mouth.  Your baby will let go of your breast by himself or herself. Signs from you  Breasts that have increased in firmness, weight, and size 1-3 hours after feeding.  Breasts that are softer immediately after breastfeeding.  Increased milk volume, as well as a change in milk consistency and color by the fifth day of breastfeeding.  Nipples that are not sore, cracked, or bleeding. Signs that your baby is getting enough milk  Wetting at least 1-2 diapers during the first 24 hours after birth.  Wetting at least 5-6 diapers every 24 hours for the first week after birth. The urine should be clear or pale yellow by the age of 5 days.  Wetting 6-8 diapers every 24 hours as your baby continues to grow and develop.  At least 3 stools in  a 24-hour period by the age of 5 days. The stool should be soft and yellow.  At least 3 stools in a 24-hour period by the age of 7 days. The stool should be seedy and yellow.  No loss of weight greater than 10% of birth weight during the first 3 days of life.  Average weight gain of 4-7 oz (113-198 g) per week after the age of 4 days.  Consistent daily weight gain by the age of 5 days, without weight loss after the age of 2 weeks. After a feeding, your baby may spit up a small amount of milk. This is normal. Breastfeeding frequency and duration Frequent feeding will help you make more milk and can prevent sore nipples and extremely full breasts (breast engorgement). Breastfeed when you feel the need to reduce the fullness of your breasts or when your baby shows signs of hunger. This is called "breastfeeding on demand." Signs that your baby is hungry include:  Increased alertness, activity, or restlessness.  Movement of the head from side to side.  Opening of the mouth when the corner of the mouth or cheek is stroked (rooting).  Increased sucking sounds, smacking  lips, cooing, sighing, or squeaking.  Hand-to-mouth movements and sucking on fingers or hands.  Fussing or crying. Avoid introducing a pacifier to your baby in the first 4-6 weeks after your baby is born. After this time, you may choose to use a pacifier. Research has shown that pacifier use during the first year of a baby's life decreases the risk of sudden infant death syndrome (SIDS). Allow your baby to feed on each breast as long as he or she wants. When your baby unlatches or falls asleep while feeding from the first breast, offer the second breast. Because newborns are often sleepy in the first few weeks of life, you may need to awaken your baby to get him or her to feed. Breastfeeding times will vary from baby to baby. However, the following rules can serve as a guide to help you make sure that your baby is properly  fed:  Newborns (babies 74 weeks of age or younger) may breastfeed every 1-3 hours.  Newborns should not go without breastfeeding for longer than 3 hours during the day or 5 hours during the night.  You should breastfeed your baby a minimum of 8 times in a 24-hour period. Breast milk pumping     Pumping and storing breast milk allows you to make sure that your baby is exclusively fed your breast milk, even at times when you are unable to breastfeed. This is especially important if you go back to work while you are still breastfeeding, or if you are not able to be present during feedings. Your lactation consultant can help you find a method of pumping that works best for you and give you guidelines about how long it is safe to store breast milk. Caring for your breasts while you breastfeed Nipples can become dry, cracked, and sore while breastfeeding. The following recommendations can help keep your breasts moisturized and healthy:  Avoid using soap on your nipples.  Wear a supportive bra designed especially for nursing. Avoid wearing underwire-style bras or extremely tight bras (sports bras).  Air-dry your nipples for 3-4 minutes after each feeding.  Use only cotton bra pads to absorb leaked breast milk. Leaking of breast milk between feedings is normal.  Use lanolin on your nipples after breastfeeding. Lanolin helps to maintain your skin's normal moisture barrier. Pure lanolin is not harmful (not toxic) to your baby. You may also hand express a few drops of breast milk and gently massage that milk into your nipples and allow the milk to air-dry. In the first few weeks after giving birth, some women experience breast engorgement. Engorgement can make your breasts feel heavy, warm, and tender to the touch. Engorgement peaks within 3-5 days after you give birth. The following recommendations can help to ease engorgement:  Completely empty your breasts while breastfeeding or pumping. You may  want to start by applying warm, moist heat (in the shower or with warm, water-soaked hand towels) just before feeding or pumping. This increases circulation and helps the milk flow. If your baby does not completely empty your breasts while breastfeeding, pump any extra milk after he or she is finished.  Apply ice packs to your breasts immediately after breastfeeding or pumping, unless this is too uncomfortable for you. To do this: ? Put ice in a plastic bag. ? Place a towel between your skin and the bag. ? Leave the ice on for 20 minutes, 2-3 times a day.  Make sure that your baby is latched on and positioned properly while  If engorgement persists after 48 hours of following these recommendations, contact your health care provider or a lactation consultant. °Overall health care recommendations while breastfeeding °· Eat 3 healthy meals and 3 snacks every day. Well-nourished mothers who are breastfeeding need an additional 450-500 calories a day. You can meet this requirement by increasing the amount of a balanced diet that you eat. °· Drink enough water to keep your urine pale yellow or clear. °· Rest often, relax, and continue to take your prenatal vitamins to prevent fatigue, stress, and low vitamin and mineral levels in your body (nutrient deficiencies). °· Do not use any products that contain nicotine or tobacco, such as cigarettes and e-cigarettes. Your baby may be harmed by chemicals from cigarettes that pass into breast milk and exposure to secondhand smoke. If you need help quitting, ask your health care provider. °· Avoid alcohol. °· Do not use illegal drugs or marijuana. °· Talk with your health care provider before taking any medicines. These include over-the-counter and prescription medicines as well as vitamins and herbal supplements. Some medicines that may be harmful to your baby can pass through breast milk. °· It is possible to become pregnant while breastfeeding. If birth  control is desired, ask your health care provider about options that will be safe while breastfeeding your baby. °Where to find more information: °[employer]: www.llli.org °Contact a health care provider if: °· You feel like you want to stop breastfeeding or have become frustrated with breastfeeding. °· Your nipples are cracked or bleeding. °· Your breasts are red, tender, or warm. °· You have: °? Painful breasts or nipples. °? A swollen area on either breast. °? A fever or chills. °? Nausea or vomiting. °? Drainage other than breast milk from your nipples. °· Your breasts do not become full before feedings by the fifth day after you give birth. °· You feel sad and depressed. °· Your baby is: °? Too sleepy to eat well. °? Having trouble sleeping. °? More than 1 week old and wetting fewer than 6 diapers in a 24-hour period. °? Not gaining weight by 5 days of age. °· Your baby has fewer than 3 stools in a 24-hour period. °· Your baby's skin or the white parts of his or her eyes become yellow. °Get help right away if: °· Your baby is overly tired (lethargic) and does not want to wake up and feed. °· Your baby develops an unexplained fever. °Summary °· Breastfeeding offers many health benefits for infant and mothers. °· Try to breastfeed your infant when he or she shows early signs of hunger. °· Gently tickle or stroke your baby's lips with your finger or nipple to allow the baby to open his or her mouth. Bring the baby to your breast. Make sure that much of the areola is in your baby's mouth. Offer one side and burp the baby before you offer the other side. °· Talk with your health care provider or lactation consultant if you have questions or you face problems as you breastfeed. °This information is not intended to replace advice given to you by your health care provider. Make sure you discuss any questions you have with your health care provider. °Document Released: 03/18/2005 Document Revised:  04/19/2016 Document Reviewed: 04/19/2016 °Elsevier Interactive Patient Education © 2019 [employer] °Vaginal Delivery, Care After °Refer to this sheet in the next few weeks. These instructions provide you with information about caring for yourself after vaginal delivery. Your health care provider may   also give you more specific instructions. Your treatment has been planned according to current medical practices, but problems sometimes occur. Call your health care provider if you have any problems or questions. What can I expect after the procedure? After vaginal delivery, it is common to have:  Some bleeding from your vagina.  Soreness in your abdomen, your vagina, and the area of skin between your vaginal opening and your anus (perineum).  Pelvic cramps.  Fatigue. Follow these instructions at home: Medicines  Take over-the-counter and prescription medicines only as told by your health care provider.  If you were prescribed an antibiotic medicine, take it as told by your health care provider. Do not stop taking the antibiotic until it is finished. Driving   Do not drive or operate heavy machinery while taking prescription pain medicine.  Do not drive for 24 hours if you received a sedative. Lifestyle  Do not drink alcohol. This is especially important if you are breastfeeding or taking medicine to relieve pain.  Do not use tobacco products, including cigarettes, chewing tobacco, or e-cigarettes. If you need help quitting, ask your health care provider. Eating and drinking  Drink at least 8 eight-ounce glasses of water every day unless you are told not to by your health care provider. If you choose to breastfeed your baby, you may need to drink more water than this.  Eat high-fiber foods every day. These foods may help prevent or relieve constipation. High-fiber foods include: ? Whole grain cereals and breads. ? Brown rice. ? Beans. ? Fresh fruits and  vegetables. Activity  Return to your normal activities as told by your health care provider. Ask your health care provider what activities are safe for you.  Rest as much as possible. Try to rest or take a nap when your baby is sleeping.  Do not lift anything that is heavier than your baby or 10 lb (4.5 kg) until your health care provider says that it is safe.  Talk with your health care provider about when you can engage in sexual activity. This may depend on your: ? Risk of infection. ? Rate of healing. ? Comfort and desire to engage in sexual activity. Vaginal Care  If you have an episiotomy or a vaginal tear, check the area every day for signs of infection. Check for: ? More redness, swelling, or pain. ? More fluid or blood. ? Warmth. ? Pus or a bad smell.  Do not use tampons or douches until your health care provider says this is safe.  Watch for any blood clots that may pass from your vagina. These may look like clumps of dark red, brown, or black discharge. General instructions  Keep your perineum clean and dry as told by your health care provider.  Wear loose, comfortable clothing.  Wipe from front to back when you use the toilet.  Ask your health care provider if you can shower or take a bath. If you had an episiotomy or a perineal tear during labor and delivery, your health care provider may tell you not to take baths for a certain length of time.  Wear a bra that supports your breasts and fits you well.  If possible, have someone help you with household activities and help care for your baby for at least a few days after you leave the hospital.  Keep all follow-up visits for you and your baby as told by your health care provider. This is important. Contact a health care provider if:  You  have: ? Vaginal discharge that has a bad smell. ? Difficulty urinating. ? Pain when urinating. ? A sudden increase or decrease in the frequency of your bowel movements. ? More  redness, swelling, or pain around your episiotomy or vaginal tear. ? More fluid or blood coming from your episiotomy or vaginal tear. ? Pus or a bad smell coming from your episiotomy or vaginal tear. ? A fever. ? A rash. ? Little or no interest in activities you used to enjoy. ? Questions about caring for yourself or your baby.  Your episiotomy or vaginal tear feels warm to the touch.  Your episiotomy or vaginal tear is separating or does not appear to be healing.  Your breasts are painful, hard, or turn red.  You feel unusually sad or worried.  You feel nauseous or you vomit.  You pass large blood clots from your vagina. If you pass a blood clot from your vagina, save it to show to your health care provider. Do not flush blood clots down the toilet without having your health care provider look at them.  You urinate more than usual.  You are dizzy or light-headed.  You have not breastfed at all and you have not had a menstrual period for 12 weeks after delivery.  You have stopped breastfeeding and you have not had a menstrual period for 12 weeks after you stopped breastfeeding. Get help right away if:  You have: ? Pain that does not go away or does not get better with medicine. ? Chest pain. ? Difficulty breathing. ? Blurred vision or spots in your vision. ? Thoughts about hurting yourself or your baby.  You develop pain in your abdomen or in one of your legs.  You develop a severe headache.  You faint.  You bleed from your vagina so much that you fill two sanitary pads in one hour. This information is not intended to replace advice given to you by your health care provider. Make sure you discuss any questions you have with your health care provider. Document Released: 03/15/2000 Document Revised: 08/30/2015 Document Reviewed: 04/02/2015 Elsevier Interactive Patient Education  2019 [employer].

## 2018-05-31 HISTORY — PX: OTHER SURGICAL HISTORY: SHX169

## 2018-06-01 ENCOUNTER — Ambulatory Visit: Payer: Medicaid Other | Admitting: Maternal Newborn

## 2018-06-10 ENCOUNTER — Other Ambulatory Visit: Payer: Self-pay

## 2018-06-10 ENCOUNTER — Ambulatory Visit (INDEPENDENT_AMBULATORY_CARE_PROVIDER_SITE_OTHER): Payer: Medicaid Other | Admitting: Maternal Newborn

## 2018-06-10 ENCOUNTER — Encounter: Payer: Self-pay | Admitting: Maternal Newborn

## 2018-06-10 NOTE — Progress Notes (Signed)
Postpartum Visit  S: Patient is here today for a postpartum check. She reports that she is recovering well. Some slight tenderness at site of laceration. She reports normal concerns about newborn care and growth.  GAD 4  EPDS 6  Original EPDS score was 9, but she changed her answer to question 3 during our discussion of her score and said that she had misunderstood the question.   O: Blood pressure 102/64, pulse 84, height 5\' 7"  (1.702 m), weight 154 lb (69.9 kg), last menstrual period 05/26/2018, currently breastfeeding.  P: No mood concerns today. Advised her to call with increase in symptoms of anxiety/depression for a visit if needed. Otherwise, return in four weeks for a postpartum check. Uncertain about IUD, has information. Discussed that she should let us know if she decides on device insertion so that there will be adequate time and device availability at her postpartum appointment.  Marcelyn Bruins, CNM 06/11/2018

## 2018-06-11 ENCOUNTER — Encounter: Payer: Self-pay | Admitting: Maternal Newborn

## 2018-07-09 ENCOUNTER — Ambulatory Visit: Payer: Medicaid Other | Admitting: Obstetrics & Gynecology

## 2018-07-15 ENCOUNTER — Encounter: Payer: Self-pay | Admitting: Obstetrics & Gynecology

## 2018-07-15 ENCOUNTER — Other Ambulatory Visit: Payer: Self-pay

## 2018-07-15 ENCOUNTER — Ambulatory Visit (INDEPENDENT_AMBULATORY_CARE_PROVIDER_SITE_OTHER): Payer: Medicaid Other | Admitting: Obstetrics & Gynecology

## 2018-07-15 DIAGNOSIS — Z1389 Encounter for screening for other disorder: Secondary | ICD-10-CM

## 2018-07-15 DIAGNOSIS — Z30430 Encounter for insertion of intrauterine contraceptive device: Secondary | ICD-10-CM | POA: Diagnosis not present

## 2018-07-15 NOTE — Patient Instructions (Signed)
Intrauterine Device Insertion, Care After    This sheet gives you information about how to care for yourself after your procedure. Your health care provider may also give you more specific instructions. If you have problems or questions, contact your health care provider.  What can I expect after the procedure?  After the procedure, it is common to have:  · Cramps and pain in the abdomen.  · Light bleeding (spotting) or heavier bleeding that is like your menstrual period. This may last for up to a few days.  · Lower back pain.  · Dizziness.  · Headaches.  · Nausea.  Follow these instructions at home:  · Before resuming sexual activity, check to make sure that you can feel the IUD string(s). You should be able to feel the end of the string(s) below the opening of your cervix. If your IUD string is in place, you may resume sexual activity.  ? If you had a hormonal IUD inserted more than 7 days after your most recent period started, you will need to use a backup method of birth control for 7 days after IUD insertion. Ask your health care provider whether this applies to you.  · Continue to check that the IUD is still in place by feeling for the string(s) after every menstrual period, or once a month.  · Take over-the-counter and prescription medicines only as told by your health care provider.  · Do not drive or use heavy machinery while taking prescription pain medicine.  · Keep all follow-up visits as told by your health care provider. This is important.  Contact a health care provider if:  · You have bleeding that is heavier or lasts longer than a normal menstrual cycle.  · You have a fever.  · You have cramps or abdominal pain that get worse or do not get better with medicine.  · You develop abdominal pain that is new or is not in the same area of earlier cramping and pain.  · You feel lightheaded or weak.  · You have abnormal or bad-smelling discharge from your vagina.  · You have pain during sexual  activity.  · You have any of the following problems with your IUD string(s):  ? The string bothers or hurts you or your sexual partner.  ? You cannot feel the string.  ? The string has gotten longer.  · You can feel the IUD in your vagina.  · You think you may be pregnant, or you miss your menstrual period.  · You think you may have an STI (sexually transmitted infection).  Get help right away if:  · You have flu-like symptoms.  · You have a fever and chills.  · You can feel that your IUD has slipped out of place.  Summary  · After the procedure, it is common to have cramps and pain in the abdomen. It is also common to have light bleeding (spotting) or heavier bleeding that is like your menstrual period.  · Continue to check that the IUD is still in place by feeling for the string(s) after every menstrual period, or once a month.  · Keep all follow-up visits as told by your health care provider. This is important.  · Contact your health care provider if you have problems with your IUD string(s), such as the string getting longer or bothering you or your sexual partner.  This information is not intended to replace advice given to you by your health care provider. Make   sure you discuss any questions you have with your health care provider.  Document Released: 11/14/2010 Document Revised: 02/07/2016 Document Reviewed: 02/07/2016  Elsevier Interactive Patient Education © 2019 [employer]

## 2018-07-15 NOTE — Progress Notes (Addendum)
  OBSTETRICS POSTPARTUM CLINIC PROGRESS NOTE  Subjective:     Janet Green is a 18 y.o. G75P1001 female who presents for a postpartum visit. She is 6 weeks postpartum following a Term pregnancy and delivery by Vaginal, no problems at delivery.  I have fully reviewed the prenatal and intrapartum course. Anesthesia: epidural.  Postpartum course has been complicated by uncomplicated.  Baby is feeding by Breast.  Bleeding: patient has not  resumed menses.  Bowel function is normal. Bladder function is normal.  Patient is not sexually active. Contraception method desired is IUD.  Postpartum depression screening: negative. Edinburgh 1.  The following portions of the patient's history were reviewed and updated as appropriate: allergies, current medications, past family history, past medical history, past social history, past surgical history and problem list.  Review of Systems Pertinent items are noted in HPI.  Objective:    BP 100/60   Ht 5\' 7"  (1.702 m)   Wt 153 lb (69.4 kg)   BMI 23.96 kg/m   General:  alert and no distress   Breasts:  inspection negative, no nipple discharge or bleeding, no masses or nodularity palpable  Lungs: clear to auscultation bilaterally  Heart:  regular rate and rhythm, S1, S2 normal, no murmur, click, rub or gallop  Abdomen: soft, non-tender; bowel sounds normal; no masses,  no organomegaly.     Vulva:  normal  Vagina: normal vagina, no discharge, exudate, lesion, or erythema  Cervix:  no cervical motion tenderness and no lesions  Corpus: normal size, contour, position, consistency, mobility, non-tender  Adnexa:  normal adnexa and no mass, fullness, tenderness  Rectal Exam: Not performed.         Assessment:  Post Partum Care visit 1. Postpartum care following vaginal delivery  2. Encounter for insertion of intrauterine contraceptive device (IUD)   Plan:  See orders and Patient Instructions Resume all normal activities Follow up in: 4 weeks or as  needed.    IUD PROCEDURE NOTE: IUD Insertion Procedure Note Patient identified, informed consent performed, consent signed.   Discussed risks of irregular bleeding, cramping, infection, malpositioning or misplacement of the IUD outside the uterus which may require further procedure such as laparoscopy, risk of failure <1%. Time out was performed.  Urine pregnancy test negative.  A bimanual exam showed the uterus to be retroverted.  Speculum placed in the vagina.  Cervix visualized.  Cleaned with Betadine x 2.  Grasped anteriorly with a single tooth tenaculum.  Uterus sounded to 7 cm.   IUD placed per manufacturer's recommendations.  Strings trimmed to 3 cm. Tenaculum was removed, good hemostasis noted.  Patient tolerated procedure well.   Patient was given post-procedure instructions.  She was advised to have backup contraception for one week.  Patient was also asked to check IUD strings periodically and follow up in 4 weeks for IUD check.  Annamarie Major, MD, [name], [facility] 07/15/2018  4:19 PM

## 2018-07-16 ENCOUNTER — Telehealth: Payer: Self-pay

## 2018-07-16 NOTE — Telephone Encounter (Signed)
-----   Message from Nadara Mustard, MD sent at 07/16/2018  2:53 PM EDT ----- Regarding: check on patient Call and see how she is after IUD procedure yesterday

## 2018-07-16 NOTE — Telephone Encounter (Signed)
Pt states she is fine and the pain has went away, advised to call, if she has any questions or concerns ,

## 2018-12-11 IMAGING — CT CT ABD-PELV W/ CM
2 of 4 series · 15 of 46 positions shown, 17 images · IV contrast (APPLIED)
Comparison: None.

CLINICAL DATA: 16-year-old with left lower quadrant abdominal pain
and left flank pain, acute onset. Current history of irritable bowel
syndrome.

EXAM:
CT ABDOMEN AND PELVIS WITH CONTRAST
TECHNIQUE: Multidetector CT imaging of the abdomen and pelvis was performed
using the standard protocol following bolus administration of
intravenous contrast.
CONTRAST:  100mL [id number] IOPAMIDOL INJECTION 61% IV. Oral
contrast was also administered.

[Series 2: axial st · axial · 0.72mm/px · z∈[-655,-210]mm · 12 of 99 slices shown, 14 images]
[im 5/99  soft-tissue]
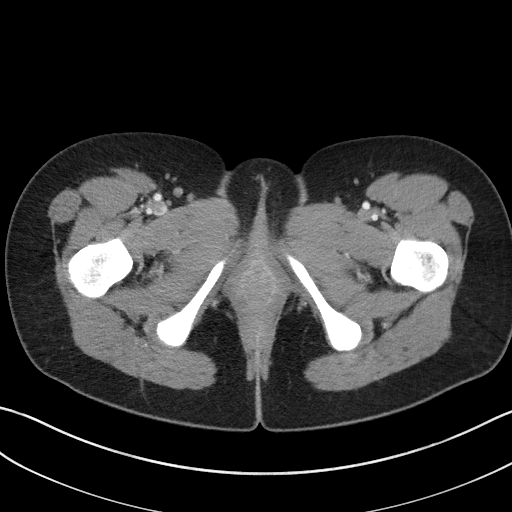
[im 5/99  bone]
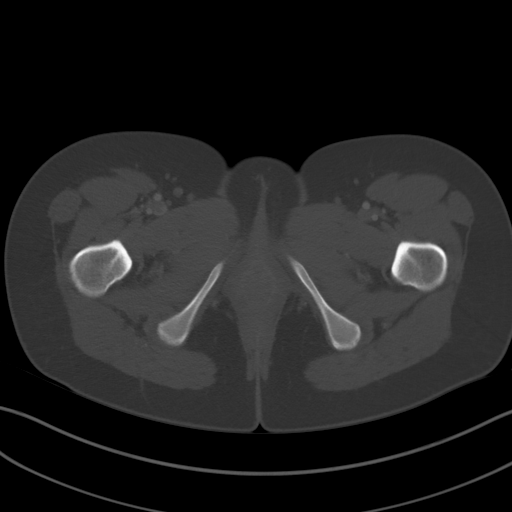
[im 13/99  soft-tissue]
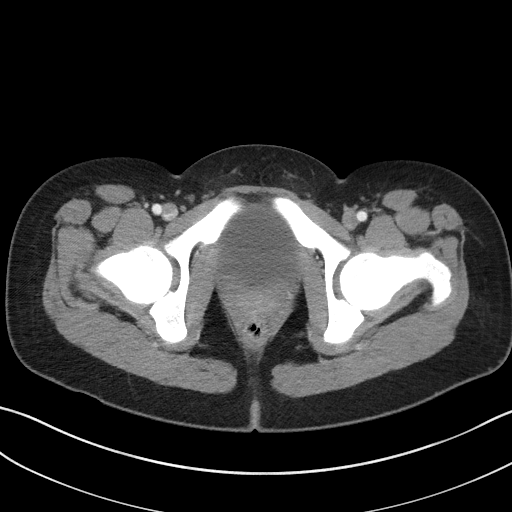
[im 21/99  soft-tissue]
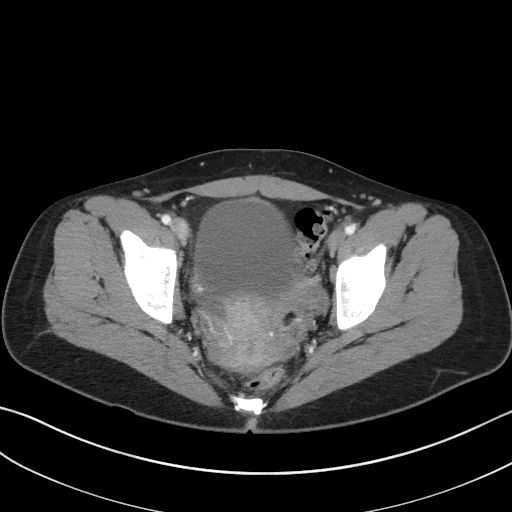
[im 29/99  soft-tissue]
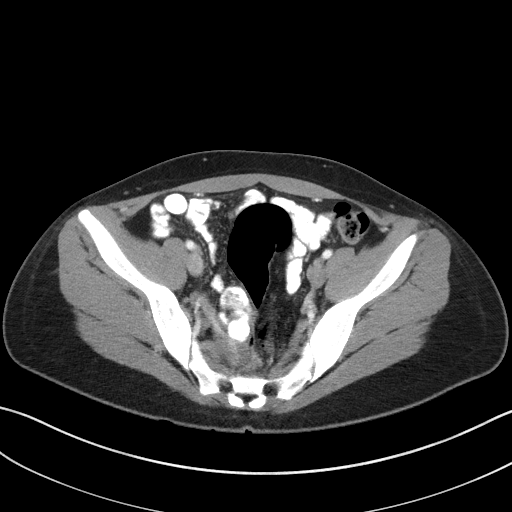
[im 37/99  soft-tissue]
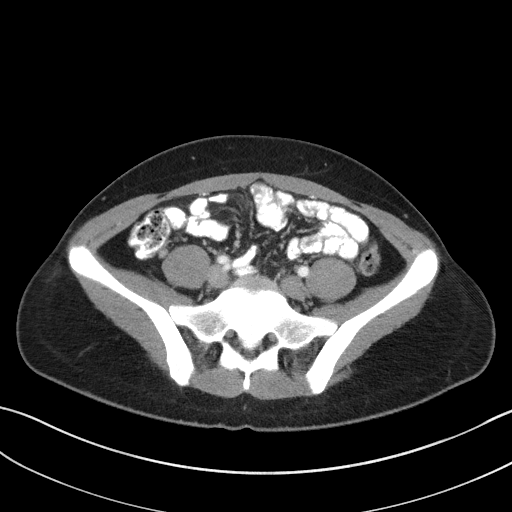
[im 45/99  soft-tissue]
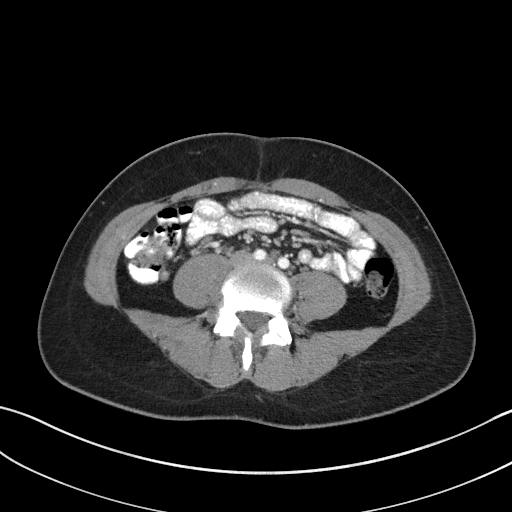
[im 54/99  soft-tissue]
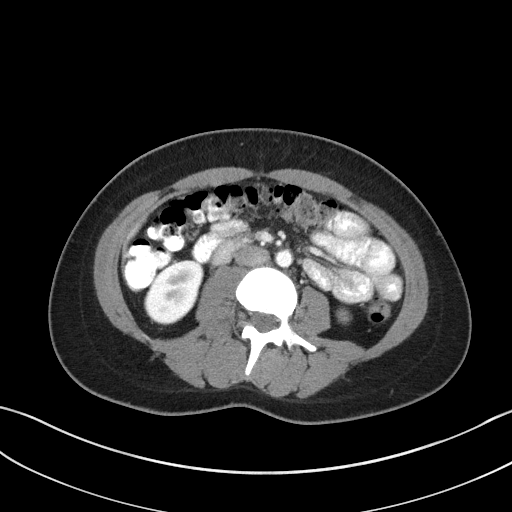
[im 62/99  soft-tissue]
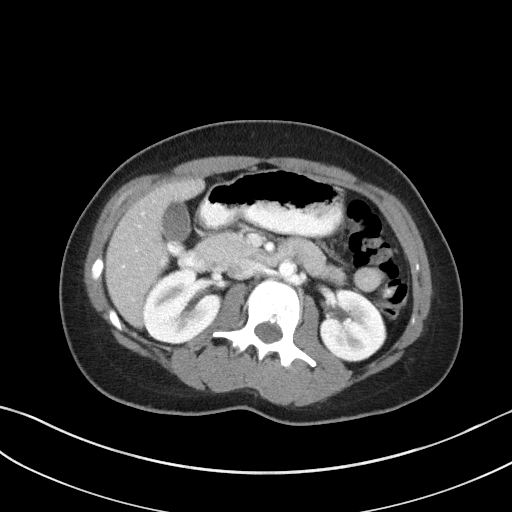
[im 70/99  soft-tissue]
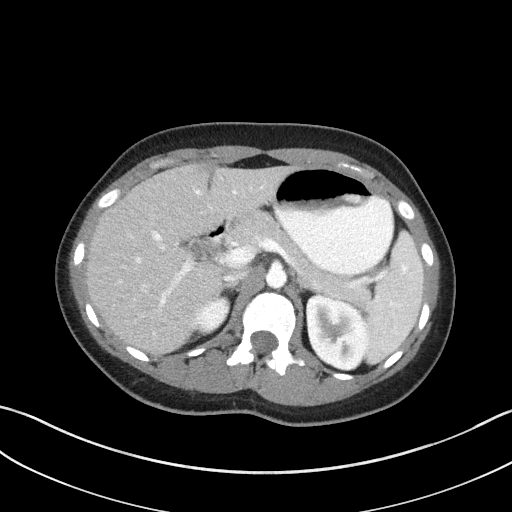
[im 70/99  bone]
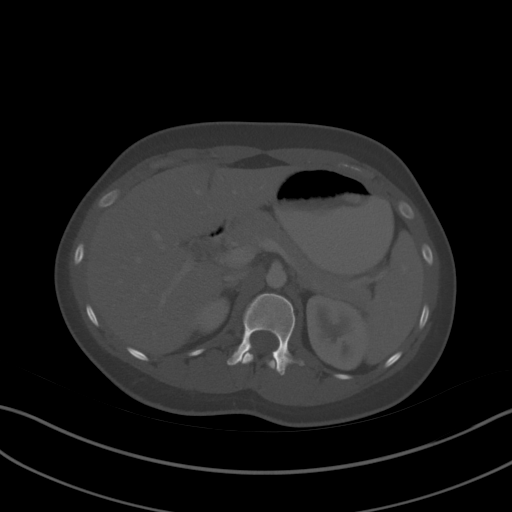
[im 78/99  soft-tissue]
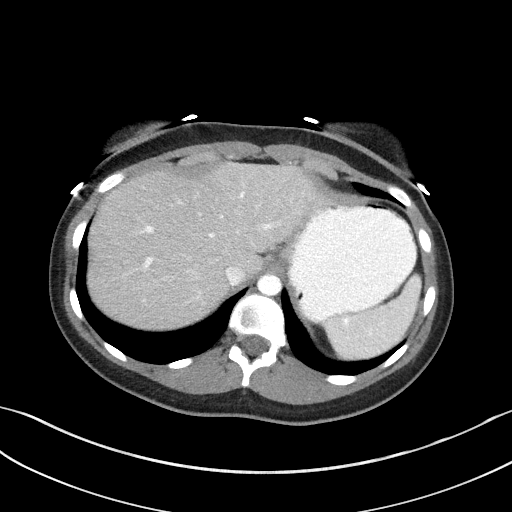
[im 86/99  soft-tissue]
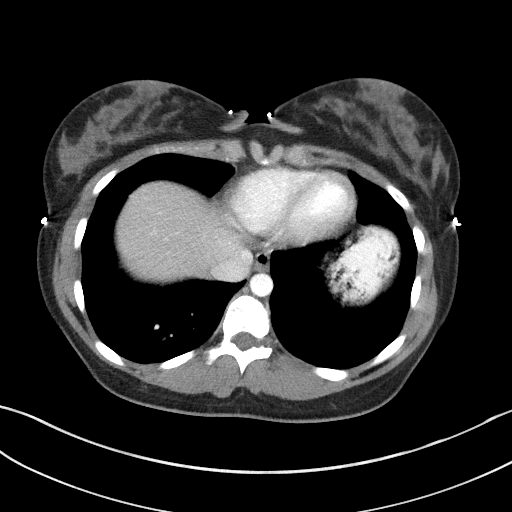
[im 94/99  soft-tissue]
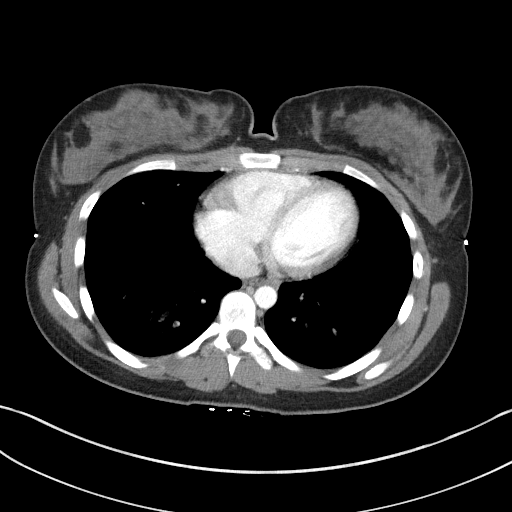

[Series 5: coronal st · coronal · 0.68mm/px · 3 of 72 slices shown]
[im 24/72  soft-tissue]
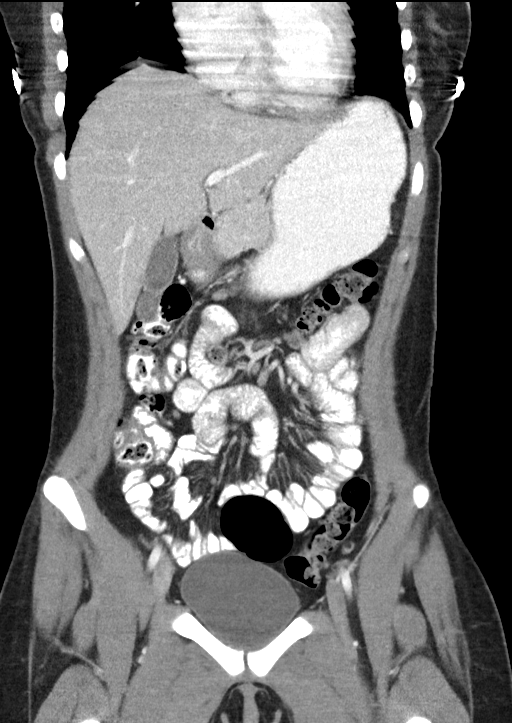
[im 32/72  soft-tissue]
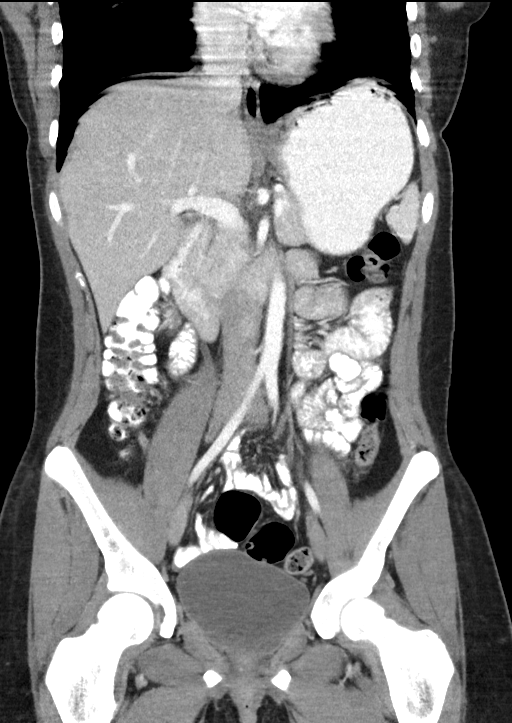
[im 40/72  soft-tissue]
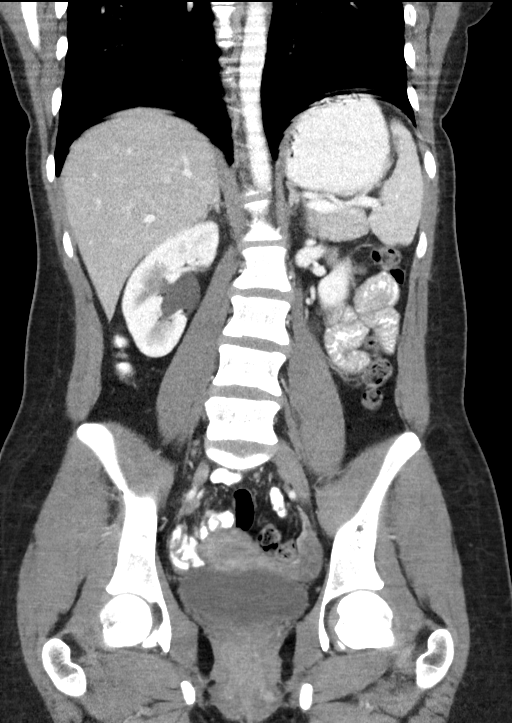

[15 of 46 positions shown; findings below may reference images not displayed]

FINDINGS: Lower chest: Heart size normal.  Visualized lung bases clear.

Hepatobiliary: Liver normal in size and appearance. Gallbladder
normal in appearance without calcified gallstones. No biliary ductal
dilation.

Pancreas: Normal in appearance without evidence of mass, ductal
dilation, or inflammation.

Spleen: Normal in size and appearance.

Adrenals/Urinary Tract: Normal appearing adrenal glands. Kidneys
normal in size and appearance without focal parenchymal abnormality.
No evidence of urinary tract calculi or obstruction.
Normal-appearing urinary bladder.

Stomach/Bowel: Stomach normal in appearance for the degree of
distention. Normal-appearing small bowel. Moderate stool burden
throughout the normal appearing colon. Normal appendix in the right
mid abdomen and right upper pelvis.

Vascular/Lymphatic: No visible aortoiliofemoral atherosclerosis.
Widely patent visceral arteries. Normal-appearing portal venous and
systemic venous systems. Circumaortic left renal vein, a normal
anatomic variant.

No pathologic lymphadenopathy.

Reproductive: Normal appearing uterus. Bilateral ovarian cysts,
including a collapsing cyst with enhancing rim in the right ovary.
Moderate free fluid in the cul-de-sac. Incidental note of a
nabothian cyst on the cervix.

Other: None.

Musculoskeletal: Thoracolumbar levoscoliosis. Regional skeleton
otherwise unremarkable.
IMPRESSION: 1. Moderate free fluid in the pelvic cul-de-sac. As there are
bilateral ovarian cysts, recent cyst rupture is certainly possible.
2. No acute or significant abnormalities otherwise.

## 2019-06-30 ENCOUNTER — Other Ambulatory Visit: Payer: Self-pay

## 2019-06-30 ENCOUNTER — Other Ambulatory Visit (HOSPITAL_COMMUNITY)
Admission: RE | Admit: 2019-06-30 | Discharge: 2019-06-30 | Disposition: A | Discharge status: Home / Self Care | Payer: Medicaid Other | Source: Ambulatory Visit | Attending: Obstetrics and Gynecology | Admitting: Obstetrics and Gynecology

## 2019-06-30 ENCOUNTER — Ambulatory Visit (INDEPENDENT_AMBULATORY_CARE_PROVIDER_SITE_OTHER): Payer: Medicaid Other | Admitting: Obstetrics and Gynecology

## 2019-06-30 ENCOUNTER — Encounter: Payer: Self-pay | Admitting: Obstetrics and Gynecology

## 2019-06-30 VITALS — BP 106/66 | HR 80 | Ht 67.0 in | Wt 133.0 lb

## 2019-06-30 DIAGNOSIS — N760 Acute vaginitis: Secondary | ICD-10-CM | POA: Insufficient documentation

## 2019-06-30 DIAGNOSIS — B9689 Other specified bacterial agents as the cause of diseases classified elsewhere: Secondary | ICD-10-CM | POA: Insufficient documentation

## 2019-06-30 MED ORDER — METRONIDAZOLE 500 MG PO TABS: 500.0000 mg | ORAL_TABLET | Freq: Two times a day (BID) | ORAL | 0 refills | Status: AC

## 2019-06-30 NOTE — Progress Notes (Signed)
Obstetrics & Gynecology Office Visit   Chief Complaint:  Chief Complaint  Patient presents with  . Vaginitis    vaginal odor, no discharge     History of Present Illness: Vaginitis: Patient complains of an abnormal vaginal discharge for 1 month. Vaginal symptoms include odor.Vulvar symptoms include none.STI Risk: Very low risk of STD exposureDischarge described as: normal and physiologic.Other associated symptoms: none.Menstrual pattern: She had been bleeding regularly. Contraception: IUD.   Past Medical History:  Diagnosis Date  . IBS (irritable bowel syndrome)   . 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 Ruptured ovarian cyst 2019    Past Surgical History:  Procedure Laterality Date  . Mirena IUD  05/2018  . WISDOM TOOTH EXTRACTION      Gynecologic History: No LMP recorded. (Menstrual status: IUD).  Obstetric History: G1P1001  Family History  Problem Relation Age of Onset  . Cancer Neg Hx   . Diabetes Neg Hx   . Hypertension Neg Hx   . Stroke Neg Hx   . Thyroid disease Neg Hx     Social History   Socioeconomic History  . Marital status: Single    Spouse name: Not on file  . Number of children: Not on file  . Years of education: Not on file  . Highest education level: Not on file  Occupational History  . Not on file  Tobacco Use  . Smoking status: Never Smoker  . Smokeless tobacco: Never Used  Substance and Sexual Activity  . Alcohol use: No  . Drug use: Yes    Types: Marijuana  . Sexual activity: Yes    Birth control/protection: I.U.D., None  Other Topics Concern  . Not on file  Social History Narrative  . Not on file   Social Determinants of Health   Financial Resource Strain:   . Difficulty of Paying Living Expenses:   Food Insecurity:   . Worried About Programme researcher, broadcasting/film/video in the Last Year:   . Barista in the Last Year:   Transportation Needs:   . Freight forwarder (Medical):   Marland Kitchen Lack of Transportation (Non-Medical):   Physical  Activity:   . Days of Exercise per Week:   . Minutes of Exercise per Session:   Stress:   . Feeling of Stress :   Social Connections:   . Frequency of Communication with Friends and Family:   . Frequency of Social Gatherings with Friends and Family:   . Attends Religious Services:   . Active Member of Clubs or Organizations:   . Attends Banker Meetings:   Marland Kitchen Marital Status:   Intimate Partner Violence:   . Fear of Current or Ex-Partner:   . Emotionally Abused:   Marland Kitchen Physically Abused:   . Sexually Abused:     No Known Allergies  Prior to Admission medications   Medication Sig Start Date End Date Taking? Authorizing Provider  levonorgestrel (MIRENA) 20 MCG/24HR IUD 1 each by Intrauterine route once.   Yes [provider]    Review of Systems  Constitutional: Negative.   HENT: Negative.   Eyes: Negative.   Respiratory: Negative.   Cardiovascular: Negative.   Gastrointestinal: Negative.   Genitourinary: Negative.        See HPI  Musculoskeletal: Negative.   Skin: Negative.   Neurological: Negative.   Psychiatric/Behavioral: Negative.      Physical Exam BP 106/66   Pulse 80   Ht 5\' 7"  (1.702 m)   Wt 133 lb (60.3  kg)   BMI 20.83 kg/m  No LMP recorded. (Menstrual status: IUD). Physical Exam Constitutional:      General: She is not in acute distress.    Appearance: Normal appearance.  Genitourinary:     Pelvic exam was performed with patient in the lithotomy position.     Vulva, inguinal canal, urethra, bladder, vagina, cervix, uterus, right adnexa and left adnexa normal.  Rectum:     No tenderness.  HENT:     Head: Normocephalic and atraumatic.  Eyes:     General: No scleral icterus.    Conjunctiva/sclera: Conjunctivae normal.  Abdominal:     Tenderness: There is no abdominal tenderness.  Neurological:     General: No focal deficit present.     Mental Status: She is alert and oriented to person, place, and time.     Cranial Nerves: No  cranial nerve deficit.  Psychiatric:        Mood and Affect: Mood normal.        Behavior: Behavior normal.        Judgment: Judgment normal.   Wet Prep: PH: 7 Clue Cells: Positive Fungal elements: Negative Trichomonas: Negative   Female chaperone present for pelvic and breast  portions of the physical exam  Assessment: 19 y.o. G85P1001 female here for  1. Bacterial vaginosis      Plan: Problem List Items Addressed This Visit    None    Visit Diagnoses    Bacterial vaginosis    -  Primary   Relevant Medications   metroNIDAZOLE (FLAGYL) 500 MG tablet   Other Relevant Orders   Cervicovaginal ancillary only     Clinically consistent with bacterial vaginosis. Screening for STDs performed. Rx for flagyl PO.   Follow up for annual when 19 yo.     Prentice Docker, MD 06/30/2019 4:14 PM

## 2019-07-02 LAB — CERVICOVAGINAL ANCILLARY ONLY
Chlamydia: NEGATIVE
Comment: NEGATIVE
Comment: NEGATIVE
Comment: NORMAL
Neisseria Gonorrhea: NEGATIVE
Trichomonas: NEGATIVE

## 2019-07-19 ENCOUNTER — Encounter: Payer: Self-pay | Admitting: Obstetrics & Gynecology

## 2019-07-19 ENCOUNTER — Ambulatory Visit (INDEPENDENT_AMBULATORY_CARE_PROVIDER_SITE_OTHER): Payer: Medicaid Other | Admitting: Obstetrics & Gynecology

## 2019-07-19 ENCOUNTER — Other Ambulatory Visit: Payer: Self-pay

## 2019-07-19 VITALS — BP 100/60 | Ht 67.0 in | Wt 129.0 lb

## 2019-07-19 DIAGNOSIS — R233 Spontaneous ecchymoses: Secondary | ICD-10-CM

## 2019-07-19 DIAGNOSIS — R209 Unspecified disturbances of skin sensation: Secondary | ICD-10-CM | POA: Diagnosis not present

## 2019-07-19 DIAGNOSIS — Z0000 Encounter for general adult medical examination without abnormal findings: Secondary | ICD-10-CM | POA: Diagnosis not present

## 2019-07-19 DIAGNOSIS — R238 Other skin changes: Secondary | ICD-10-CM

## 2019-07-19 DIAGNOSIS — Z01419 Encounter for gynecological examination (general) (routine) without abnormal findings: Secondary | ICD-10-CM | POA: Diagnosis not present

## 2019-07-19 NOTE — Progress Notes (Signed)
HPI:      [name] is a 19 y.o. G1P1001 who LMP was No LMP recorded. (Menstrual status: IUD)., she presents today for her annual examination. The patient has no complaints today. The patient is sexually active. Her last GC/Chl screen was 3/31, neg. The patient does perform self breast exams.  There is no notable family history of breast or ovarian cancer in her family.  The patient has regular exercise: yes.  The patient denies current symptoms of depression.  Pt reports numbness in toes, cold fingers, easy bruising, fatigue.  GYN History: Contraception: IUD since PP visit last year No periods, no concerns  PMHx: Past Medical History:  Diagnosis Date  . IBS (irritable bowel syndrome)   . 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 Ruptured ovarian cyst 2019   Past Surgical History:  Procedure Laterality Date  . Mirena IUD  05/2018  . WISDOM TOOTH EXTRACTION     Family History  Problem Relation Age of Onset  . Cancer Neg Hx   . Diabetes Neg Hx   . Hypertension Neg Hx   . Stroke Neg Hx   . Thyroid disease Neg Hx    Social History   Tobacco Use  . Smoking status: Never Smoker  . Smokeless tobacco: Never Used  Substance Use Topics  . Alcohol use: No  . Drug use: Yes    Types: Marijuana    Current Outpatient Medications:  .  levonorgestrel (MIRENA) 20 MCG/24HR IUD, 1 each by Intrauterine route once., Disp: , Rfl:  Allergies: Patient has no known allergies.  Review of Systems  Constitutional: Positive for malaise/fatigue. Negative for chills and fever.  HENT: Negative for congestion, sinus pain and sore throat.   Eyes: Negative for blurred vision and pain.  Respiratory: Negative for cough and wheezing.   Cardiovascular: Negative for chest pain and leg swelling.  Gastrointestinal: Negative for abdominal pain, constipation, diarrhea, heartburn, nausea and vomiting.  Genitourinary: Negative for dysuria, frequency, hematuria and urgency.  Musculoskeletal: Negative for back  pain, joint pain, myalgias and neck pain.  Skin: Negative for itching and rash.  Neurological: Negative for dizziness, tremors and weakness.  Endo/Heme/Allergies: Bruises/bleeds easily.  Psychiatric/Behavioral: Negative for depression. The patient is not nervous/anxious and does not have insomnia.     Objective: BP 100/60   Ht 5\' 7"  (1.702 m)   Wt 129 lb (58.5 kg)   BMI 20.20 kg/m   Filed Weights   07/19/19 0851  Weight: 129 lb (58.5 kg)   Body mass index is 20.2 kg/m. Physical Exam Constitutional:      General: She is not in acute distress.    Appearance: She is well-developed.  Genitourinary:     Pelvic exam was performed with patient supine.     Vagina, uterus and rectum normal.     No lesions in the vagina.     No vaginal bleeding.     No cervical motion tenderness, friability, lesion or polyp.     Uterus is mobile.     Uterus is not enlarged.     No uterine mass detected.    Uterus is midaxial.     No right or left adnexal mass present.     Right adnexa not tender.     Left adnexa not tender.  HENT:     Head: Normocephalic and atraumatic. No laceration.     Right Ear: Hearing normal.     Left Ear: Hearing normal.     Mouth/Throat:  Pharynx: Uvula midline.  Eyes:     Pupils: Pupils are equal, round, and reactive to light.  Neck:     Thyroid: No thyromegaly.  Cardiovascular:     Rate and Rhythm: Normal rate and regular rhythm.     Heart sounds: No murmur. No friction rub. No gallop.   Pulmonary:     Effort: Pulmonary effort is normal. No respiratory distress.     Breath sounds: Normal breath sounds. No wheezing.  Abdominal:     General: Bowel sounds are normal. There is no distension.     Palpations: Abdomen is soft.     Tenderness: There is no abdominal tenderness. There is no rebound.  Musculoskeletal:        General: Normal range of motion.     Cervical back: Normal range of motion and neck supple.  Neurological:     Mental Status: She is alert and  oriented to person, place, and time.     Cranial Nerves: No cranial nerve deficit.  Skin:    General: Skin is warm and dry.  Psychiatric:        Judgment: Judgment normal.  Vitals reviewed.     Assessment:  ANNUAL EXAM 1. Women's annual routine gynecological examination   2. Easy bruising   3. Sensation of cold in finger      Screening Plan:            1.  Cervical Screening-  Due next year after she turns 19  2. Labs for diagnoses to look for anemia, iron, lupus or other etiologies  3. Counseling for contraception: IUD  Cont for now, no concerns    F/U  Return in about 1 year (around 07/18/2020) for Annual.  Barnett Applebaum, MD, [facility], [facility] 07/19/2019  9:20 AM

## 2019-07-20 LAB — CBC WITH DIFFERENTIAL
Basophils Absolute: 0 10*3/uL (ref 0.0–0.2)
Basos: 1 %
EOS (ABSOLUTE): 0 10*3/uL (ref 0.0–0.4)
Eos: 0 %
Hematocrit: 38.4 % (ref 34.0–46.6)
Hemoglobin: 12.9 g/dL (ref 11.1–15.9)
Immature Grans (Abs): 0 10*3/uL (ref 0.0–0.1)
Immature Granulocytes: 0 %
Lymphocytes Absolute: 2.2 10*3/uL (ref 0.7–3.1)
Lymphs: 29 %
MCH: 30.9 pg (ref 26.6–33.0)
MCHC: 33.6 g/dL (ref 31.5–35.7)
MCV: 92 fL (ref 79–97)
Monocytes Absolute: 0.6 10*3/uL (ref 0.1–0.9)
Monocytes: 8 %
Neutrophils Absolute: 4.6 10*3/uL (ref 1.4–7.0)
Neutrophils: 62 %
RBC: 4.18 x10E6/uL (ref 3.77–5.28)
RDW: 12.7 % (ref 11.7–15.4)
WBC: 7.5 10*3/uL (ref 3.4–10.8)

## 2019-07-20 LAB — IRON: Iron: 84 ug/dL (ref 27–159)

## 2019-07-20 LAB — ANA: Anti Nuclear Antibody (ANA): NEGATIVE

## 2019-07-20 LAB — SEDIMENTATION RATE: Sed Rate: 5 mm/hr (ref 0–32)

## 2019-08-19 IMAGING — US US EXTREM LOW VENOUS*L*
1 series · 13 of 24 positions shown · non-contrast
Comparison: None.

CLINICAL DATA: Left inguinal pain



[Series 1: us extrem low venous*left* · 0.07mm/px · 13 of 34 slices shown]
[im 1/34]
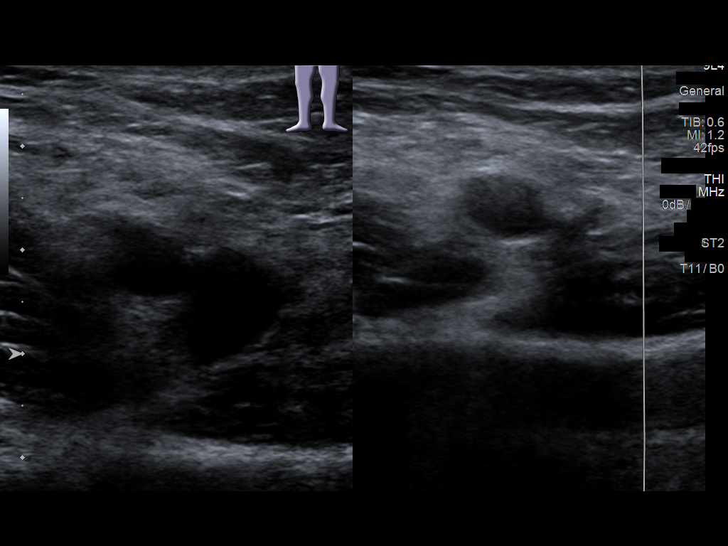
[im 3/34]
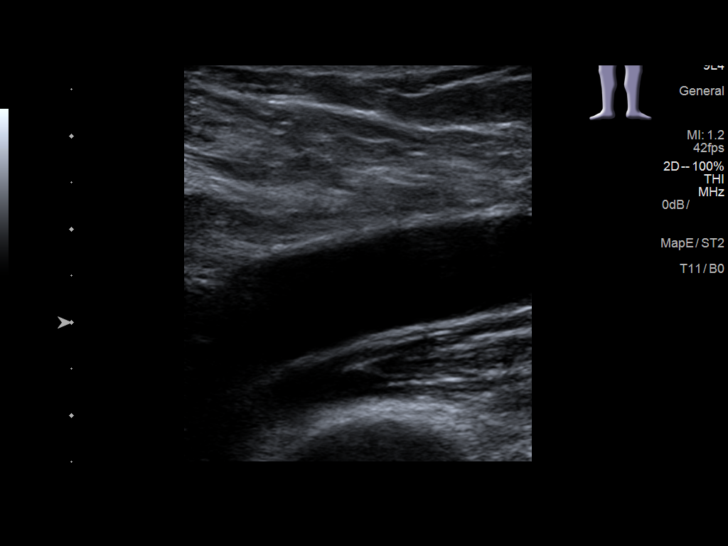
[im 6/34]
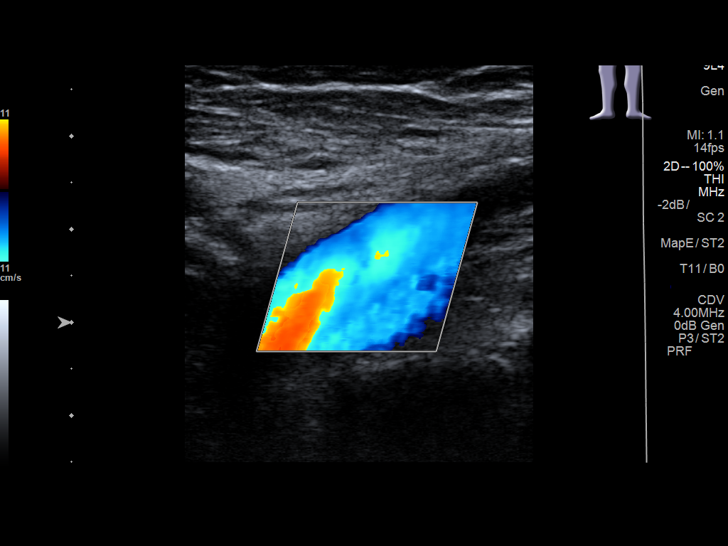
[im 9/34]
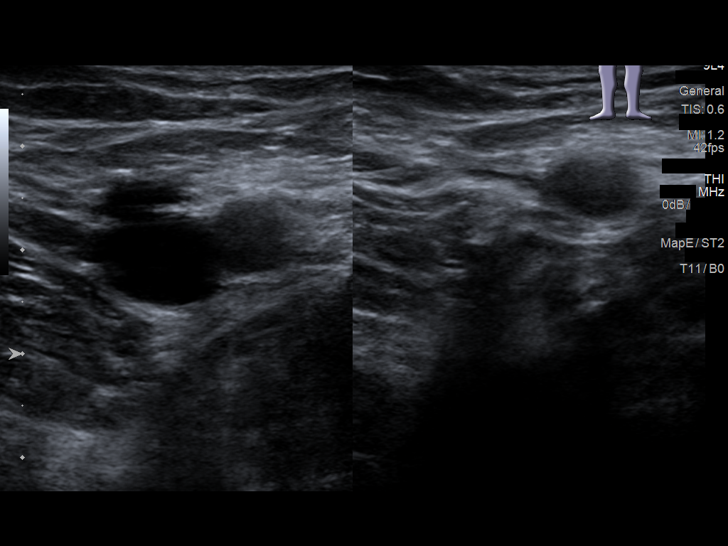
[im 12/34]
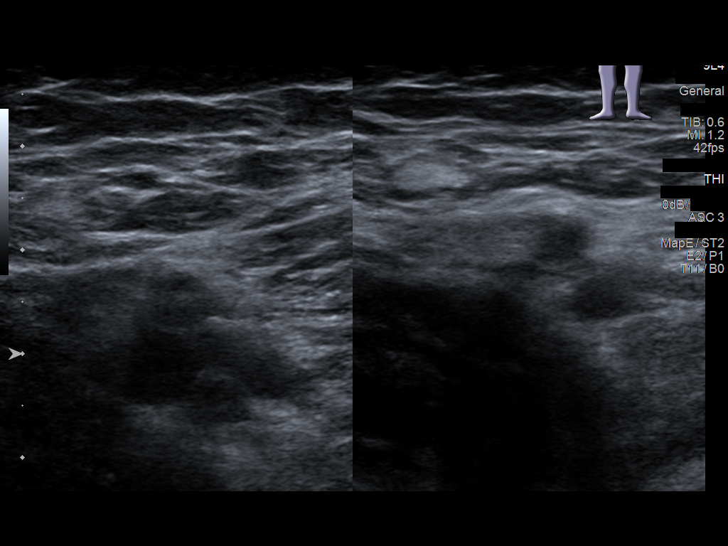
[im 15/34]
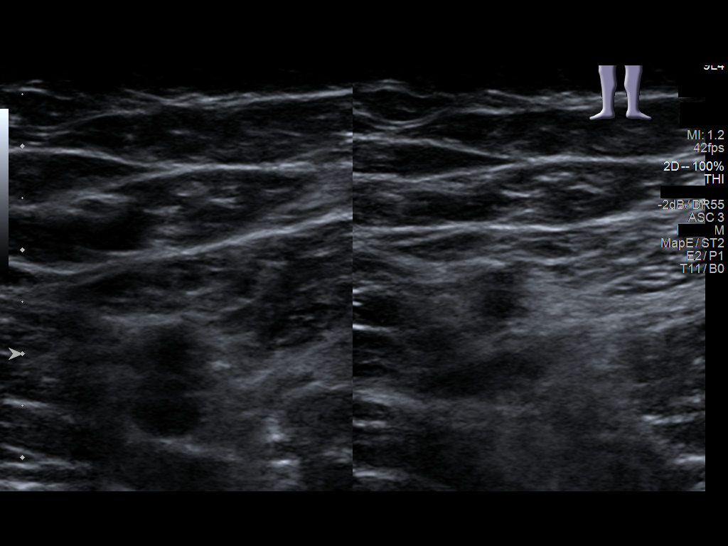
[im 18/34]
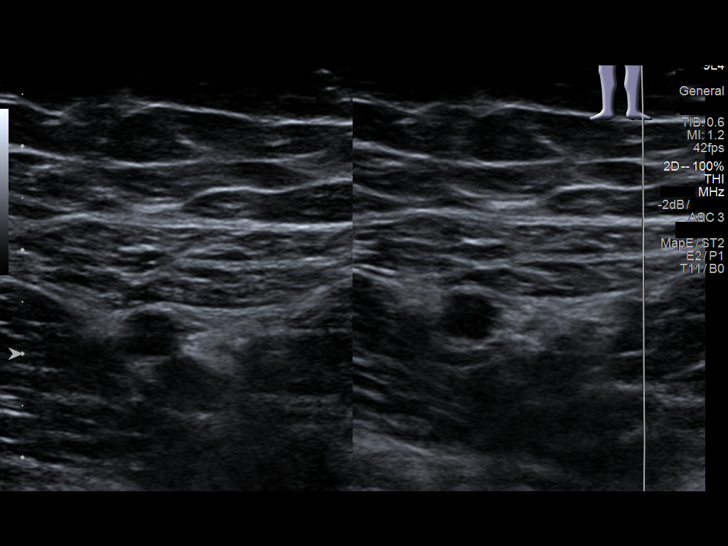
[im 19/34]
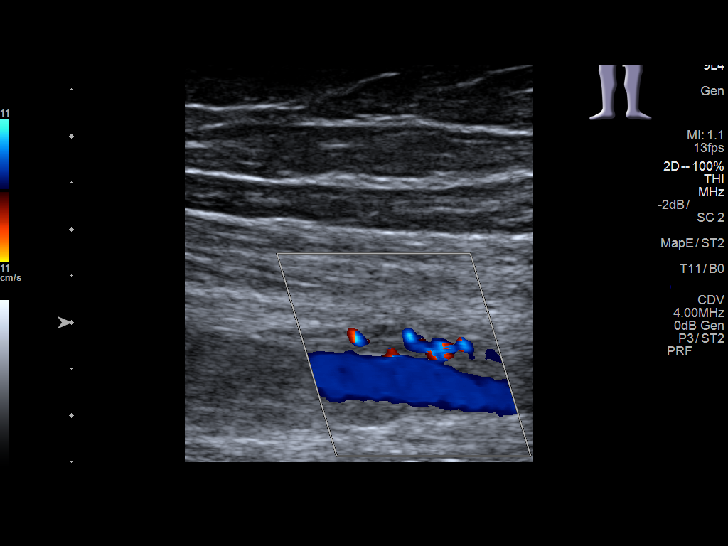
[im 22/34]
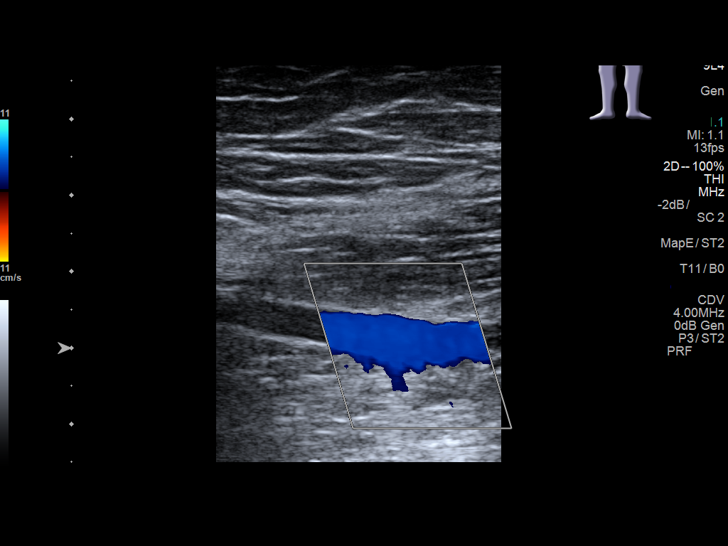
[im 25/34]
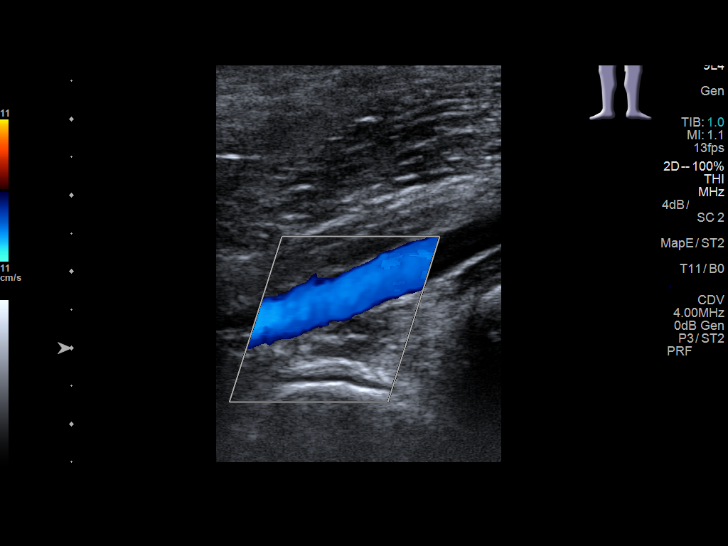
[im 28/34]
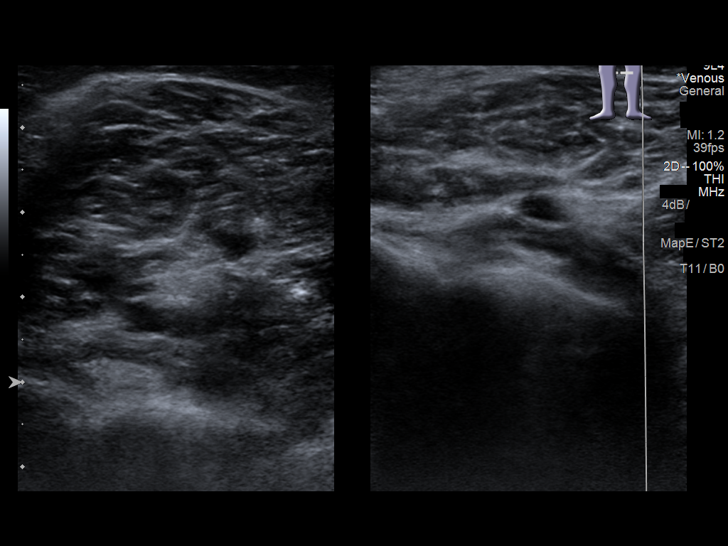
[im 31/34]
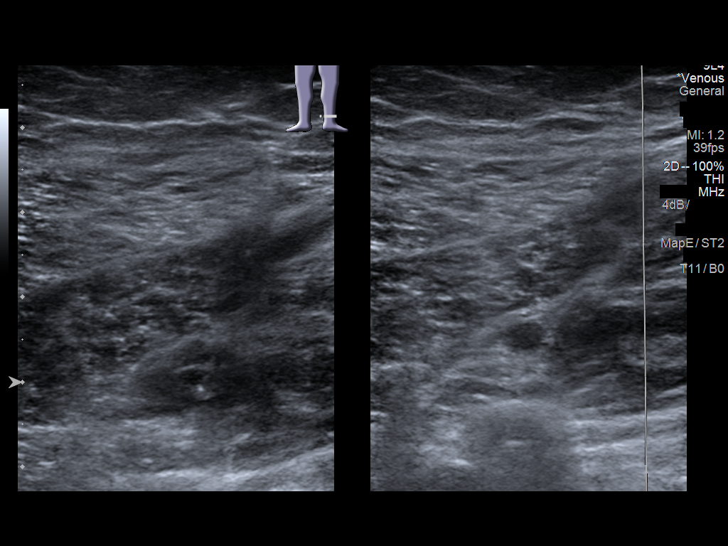
[im 34/34]
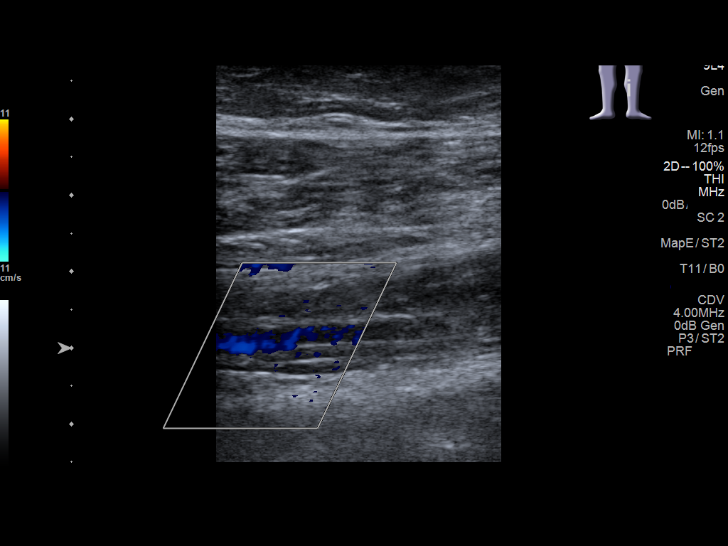

[13 of 24 positions shown; findings below may reference images not displayed]

FINDINGS: Contralateral Common Femoral Vein: Respiratory phasicity is normal
and symmetric with the symptomatic side. No evidence of thrombus.
Normal compressibility.

Common Femoral Vein: No evidence of thrombus. Normal
compressibility, respiratory phasicity and response to augmentation.

Saphenofemoral Junction: No evidence of thrombus. Normal
compressibility and flow on color Doppler imaging.

Profunda Femoral Vein: No evidence of thrombus. Normal
compressibility and flow on color Doppler imaging.

Femoral Vein: No evidence of thrombus. Normal compressibility,
respiratory phasicity and response to augmentation.

Popliteal Vein: No evidence of thrombus. Normal compressibility,
respiratory phasicity and response to augmentation.

Calf Veins: No evidence of thrombus. Normal compressibility and flow
on color Doppler imaging.

Superficial Great Saphenous Vein: No evidence of thrombus. Normal
compressibility.

Venous Reflux:  None.

Other Findings:  None.
IMPRESSION: No evidence of deep venous thrombosis.

## 2019-11-07 NOTE — Progress Notes (Deleted)
    Patient, No Pcp Per   No chief complaint on file.   HPI:      Janet Green is a 19 y.o. G1P1001 whose LMP was No LMP recorded. (Menstrual status: IUD)., presents today for ***  Treated for BV with flagyl 3/21  Past Medical History:  Diagnosis Date  . IBS (irritable bowel syndrome)   . 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 Ruptured ovarian cyst 2019    Past Surgical History:  Procedure Laterality Date  . Mirena IUD  05/2018  . WISDOM TOOTH EXTRACTION      Family History  Problem Relation Age of Onset  . Cancer Neg Hx   . Diabetes Neg Hx   . Hypertension Neg Hx   . Stroke Neg Hx   . Thyroid disease Neg Hx     Social History   Socioeconomic History  . Marital status: Single    Spouse name: Not on file  . Number of children: Not on file  . Years of education: Not on file  . Highest education level: Not on file  Occupational History  . Not on file  Tobacco Use  . Smoking status: Never Smoker  . Smokeless tobacco: Never Used  Vaping Use  . Vaping Use: Every day  Substance and Sexual Activity  . Alcohol use: No  . Drug use: Yes    Types: Marijuana  . Sexual activity: Yes    Birth control/protection: I.U.D., None  Other Topics Concern  . Not on file  Social History Narrative  . Not on file   Social Determinants of Health   Financial Resource Strain:   . Difficulty of Paying Living Expenses:   Food Insecurity:   . Worried About Programme researcher, broadcasting/film/video in the Last Year:   . Barista in the Last Year:   Transportation Needs:   . Freight forwarder (Medical):   Marland Kitchen Lack of Transportation (Non-Medical):   Physical Activity:   . Days of Exercise per Week:   . Minutes of Exercise per Session:   Stress:   . Feeling of Stress :   Social Connections:   . Frequency of Communication with Friends and Family:   . Frequency of Social Gatherings with Friends and Family:   . Attends Religious Services:   . Active Member of Clubs or Organizations:   .  Attends Banker Meetings:   Marland Kitchen Marital Status:   Intimate Partner Violence:   . Fear of Current or Ex-Partner:   . Emotionally Abused:   Marland Kitchen Physically Abused:   . Sexually Abused:     Outpatient Medications Prior to Visit  Medication Sig Dispense Refill  . levonorgestrel (MIRENA) 20 MCG/24HR IUD 1 each by Intrauterine route once.     No facility-administered medications prior to visit.      ROS:  Review of Systems BREAST: No symptoms   OBJECTIVE:   Vitals:  There were no vitals taken for this visit.  Physical Exam  Results: No results found for this or any previous visit (from the past 24 hour(s)).   Assessment/Plan: No diagnosis found.    No orders of the defined types were placed in this encounter.     No follow-ups on file.  Shatasha Lambing B. Adonai Selsor, PA-C 11/07/2019 3:50 PM

## 2019-11-08 ENCOUNTER — Ambulatory Visit: Payer: Medicaid Other | Admitting: Obstetrics and Gynecology

## 2019-12-13 DIAGNOSIS — J029 Acute pharyngitis, unspecified: Secondary | ICD-10-CM | POA: Diagnosis not present

## 2020-01-12 ENCOUNTER — Ambulatory Visit (INDEPENDENT_AMBULATORY_CARE_PROVIDER_SITE_OTHER): Payer: Medicaid Other | Admitting: Obstetrics and Gynecology

## 2020-01-12 ENCOUNTER — Other Ambulatory Visit: Payer: Self-pay

## 2020-01-12 VITALS — BP 100/68 | Ht 67.0 in | Wt 123.4 lb

## 2020-01-12 DIAGNOSIS — A749 Chlamydial infection, unspecified: Secondary | ICD-10-CM | POA: Diagnosis not present

## 2020-01-12 DIAGNOSIS — A549 Gonococcal infection, unspecified: Secondary | ICD-10-CM | POA: Diagnosis not present

## 2020-01-12 MED ORDER — CEFTRIAXONE SODIUM 500 MG IJ SOLR
500.0000 mg | Freq: Once | INTRAMUSCULAR | Status: AC
  Administered 2020-01-12: 500 mg via INTRAMUSCULAR

## 2020-01-12 NOTE — Patient Instructions (Signed)

## 2020-01-12 NOTE — Progress Notes (Signed)
Pt states she needs some treatment for her STD and a vaginal lesion.

## 2020-01-12 NOTE — Progress Notes (Signed)
Patient ID: Janet Green, female   DOB: [birth year], 19 y.o.   MRN: [id number]  Reason for Consult: Gynecologic Exam   Referred by No ref. provider found  Subjective:     HPI:  Janet Green is a 19 y.o. female. She reports that she is concerned regarding a hole that she recently notice on her left labia. It is the same spot where she had a tear with her vaginal delivery. She has not previously noticed the hole.   On 01/09/2020 She was seen at the urgent care. She just found out that she was positive for gonorrhea and chlamydia. She has not yet been treated for these infections.    Past Medical History:  Diagnosis Date  . IBS (irritable bowel syndrome)   . 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 Ruptured ovarian cyst 2019   Family History  Problem Relation Age of Onset  . Cancer Neg Hx   . Diabetes Neg Hx   . Hypertension Neg Hx   . Stroke Neg Hx   . Thyroid disease Neg Hx    Past Surgical History:  Procedure Laterality Date  . Mirena IUD  05/2018  . WISDOM TOOTH EXTRACTION      Short Social History:  Social History   Tobacco Use  . Smoking status: Never Smoker  . Smokeless tobacco: Never Used  Substance Use Topics  . Alcohol use: No    No Known Allergies  Current Outpatient Medications  Medication Sig Dispense Refill  . doxycycline (VIBRAMYCIN) 100 MG capsule Take 100 mg by mouth 2 (two) times daily.    Marland Kitchen levonorgestrel (MIRENA) 20 MCG/24HR IUD 1 each by Intrauterine route once.    . nitrofurantoin, macrocrystal-monohydrate, (MACROBID) 100 MG capsule Take 100 mg by mouth 2 (two) times daily.     No current facility-administered medications for this visit.    Review of Systems  Constitutional: Negative for chills, fatigue, fever and unexpected weight change.  HENT: Negative for trouble swallowing.  Eyes: Negative for loss of vision.  Respiratory: Negative for cough, shortness of breath and wheezing.  Cardiovascular: Negative for chest pain, leg swelling,  palpitations and syncope.  GI: Negative for abdominal pain, blood in stool, diarrhea, nausea and vomiting.  GU: Negative for difficulty urinating, dysuria, frequency and hematuria.  Musculoskeletal: Negative for back pain, leg pain and joint pain.  Skin: Negative for rash.  Neurological: Negative for dizziness, headaches, light-headedness, numbness and seizures.  Psychiatric: Negative for behavioral problem, confusion, depressed mood and sleep disturbance.        Objective:  Objective   Vitals:   01/12/20 1601  BP: 100/68  Weight: 123 lb 6.4 oz (56 kg)  Height: 5\' 7"  (1.702 m)   Body mass index is 19.33 kg/m.  Physical Exam Vitals and nursing note reviewed.  Constitutional:      Appearance: She is well-developed.  HENT:     Head: Normocephalic and atraumatic.  Eyes:     Pupils: Pupils are equal, round, and reactive to light.  Cardiovascular:     Rate and Rhythm: Normal rate and regular rhythm.  Pulmonary:     Effort: Pulmonary effort is normal. No respiratory distress.  Genitourinary:   Skin:    General: Skin is warm and dry.  Neurological:     Mental Status: She is alert and oriented to person, place, and time.  Psychiatric:        Behavior: Behavior normal.        Thought Content: Thought content  normal.        Judgment: Judgment normal.         Assessment/Plan:     19 yo  1) Hole in left labia. Reassurance, likely secondary to delivery laceration. No evidence of an erosive ulcer, edges are well healed. Discussed option for surgical correction of the hole,  but also discussed that this type of surgery may not have a favorable outcome.   It is possible that with future vaginal deliveries a laceration in that area could reoccur.   2) Treated for gonorrhea and chlamydia- RX sent and rocephin given today in office.  Discussed refraining from intercourse and partner treatment.   More than 20 minutes were spent face to face with the patient in the room, reviewing  the medical record, labs and images, and coordinating care for the patient. The plan of management was discussed in detail and counseling was provided.    Adelene Idler MD, [name], [facility] 01/12/2020 4:54 PM

## 2020-01-24 ENCOUNTER — Encounter: Payer: Self-pay | Admitting: Obstetrics and Gynecology

## 2020-03-14 ENCOUNTER — Encounter: Payer: Self-pay | Admitting: Obstetrics and Gynecology

## 2020-03-14 ENCOUNTER — Other Ambulatory Visit: Payer: Self-pay

## 2020-03-14 ENCOUNTER — Ambulatory Visit (INDEPENDENT_AMBULATORY_CARE_PROVIDER_SITE_OTHER): Payer: Medicaid Other | Admitting: Obstetrics and Gynecology

## 2020-03-14 VITALS — BP 112/70 | Ht 67.0 in | Wt 115.2 lb

## 2020-03-14 DIAGNOSIS — N644 Mastodynia: Secondary | ICD-10-CM

## 2020-03-14 DIAGNOSIS — Z113 Encounter for screening for infections with a predominantly sexual mode of transmission: Secondary | ICD-10-CM | POA: Diagnosis not present

## 2020-03-14 NOTE — Progress Notes (Signed)
Pt here for follow up on a STD and Left breast pain x 1 week.

## 2020-04-04 ENCOUNTER — Other Ambulatory Visit: Payer: Self-pay

## 2020-04-04 ENCOUNTER — Encounter (HOSPITAL_COMMUNITY): Payer: Self-pay | Admitting: Emergency Medicine

## 2020-04-04 ENCOUNTER — Ambulatory Visit (HOSPITAL_COMMUNITY)
Admission: EM | Admit: 2020-04-04 | Discharge: 2020-04-04 | Disposition: A | Discharge status: Home / Self Care | Payer: Medicaid Other | Attending: Student | Admitting: Student

## 2020-04-04 DIAGNOSIS — Z20822 Contact with and (suspected) exposure to covid-19: Secondary | ICD-10-CM | POA: Insufficient documentation

## 2020-04-04 LAB — RESP PANEL BY RT-PCR (FLU A&B, COVID) ARPGX2
Influenza A by PCR: NEGATIVE
Influenza B by PCR: NEGATIVE
SARS Coronavirus 2 by RT PCR: POSITIVE — AB

## 2020-04-04 NOTE — Discharge Instructions (Addendum)
-  For fevers/chills, body aches, headaches- use Tylenol and Ibuprofen. You can alternate these for maximum effect. Use up to 3000mg Tylenol daily and 3200mg Ibuprofen daily. Make sure to take ibuprofen with food. Check the bottle of ibuprofen/tylenol for specific dosage instructions.  We are currently awaiting result of your PCR covid-19 test. This typically comes back in 1-2 days. We'll call you if the result is positive. Otherwise, the result will be sent electronically to your MyChart. You can also call this clinic and ask for your result via telephone.   Please isolate at home while awaiting these results. If your test is positive for Covid-19, continue to isolate at home for 5 days if you have mild symptoms, or a total of 10 days from symptom onset if you have more severe symptoms. If you quarantine for a shorter period of time (i.e. 5 days), make sure to wear a mask until day 10 of symptoms. Treat your symptoms at home with OTC remedies like tylenol/ibuprofen, mucinex, nyquil, etc. Seek medical attention if you develop high fevers, chest pain, shortness of breath, ear pain, facial pain, etc. Make sure to get up and move around every 2-3 hours while convalescing to help prevent blood clots. Drink plenty of fluids, and rest as much as possible.  

## 2020-04-04 NOTE — ED Triage Notes (Signed)
Pt c/o cold sx associated w/FEVER, COUGH, HEADACHE, CONGESTION  Reports she did a home COVID test and it came back positive  Needing note for work.   A&O x4... NAD.Marland Kitchen. ambulatory

## 2020-04-04 NOTE — ED Provider Notes (Signed)
[facility]    CSN: [id number] Arrival date & time: 04/04/20  1624      History   Chief Complaint No chief complaint on file.   HPI Janet Green is a 20 y.o. female Presenting for URI symptoms for 2 days following positive at home covid test. History of IBS, ruptured ovarian cyst. Endorses fevers, cough, headache, congestion. Denies n/v/d, shortness of breath, chest pain, , facial pain, teeth pain, , sore throat, loss of taste/smell, swollen lymph nodes, ear pain.  Denies worst headache of life, thunderclap headache, weakness/sensation changes in arms/legs, vision changes, shortness of breath, chest pain/pressure, photophobia, phonophobia, n/v/d.  Denies chest pain, shortness of breath, confusion, high fevers.  Not vaccinated for covid-19.   HPI  Past Medical History:  Diagnosis Date  . IBS (irritable bowel syndrome)   . 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 Ruptured ovarian cyst 2019    Patient Active Problem List   Diagnosis Date Noted  . Postpartum care following vaginal delivery 05/28/2018  . 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 Anxiety during pregnancy 12/18/2017    Past Surgical History:  Procedure Laterality Date  . Mirena IUD  05/2018  . WISDOM TOOTH EXTRACTION      OB History    Gravida  1   Para  1   Term  1   Preterm  0   AB  0   Living  1     SAB  0   IAB  0   Ectopic  0   Multiple  0   Live Births  1            Home Medications    Prior to Admission medications   Medication Sig Start Date End Date Taking? Authorizing Provider  doxycycline (VIBRAMYCIN) 100 MG capsule Take 100 mg by mouth 2 (two) times daily. 01/11/20   [provider]  levonorgestrel (MIRENA) 20 MCG/24HR IUD 1 each by Intrauterine route once.    [provider]  nitrofurantoin, macrocrystal-monohydrate, (MACROBID) 100 MG capsule Take 100 mg by mouth 2 (two) times daily. 01/09/20   [provider]    Family  History Family History  Problem Relation Age of Onset  . Cancer Neg Hx   . Diabetes Neg Hx   . Hypertension Neg Hx   . Stroke Neg Hx   . Thyroid disease Neg Hx     Social History Social History   Tobacco Use  . Smoking status: Never Smoker  . Smokeless tobacco: Never Used  Vaping Use  . Vaping Use: Every day  Substance Use Topics  . Alcohol use: No  . Drug use: Yes    Types: Marijuana     Allergies   Patient has no known allergies.   Review of Systems Review of Systems  Constitutional: Negative for appetite change, chills and fever.  HENT: Positive for congestion and sore throat. Negative for ear pain, rhinorrhea, sinus pressure and sinus pain.   Eyes: Negative for redness and visual disturbance.  Respiratory: Positive for cough. Negative for chest tightness, shortness of breath and wheezing.   Cardiovascular: Negative for chest pain and palpitations.  Gastrointestinal: Negative for abdominal pain, constipation, diarrhea, nausea and vomiting.  Genitourinary: Negative for dysuria, frequency and urgency.  Musculoskeletal: Negative for myalgias.  Neurological: Positive for headaches. Negative for dizziness and weakness.  Psychiatric/Behavioral: Negative for confusion.  All other systems reviewed and are negative.    Physical Exam Triage Vital Signs ED Triage Vitals  Enc  Vitals Group     BP      Pulse      Resp      Temp      Temp src      SpO2      Weight      Height      Head Circumference      Peak Flow      Pain Score      Pain Loc      Pain Edu?      Excl. in GC?    No data found.  Updated Vital Signs BP 102/61 (BP Location: Left Arm)   Pulse 62   Temp 98.3 F (36.8 C) (Oral)   Resp 17   LMP 02/29/2020   SpO2 100%   Breastfeeding No   Visual Acuity Right Eye Distance:   Left Eye Distance:   Bilateral Distance:    Right Eye Near:   Left Eye Near:    Bilateral Near:     Physical Exam Vitals reviewed.  Constitutional:      General:  She is not in acute distress.    Appearance: Normal appearance. She is not ill-appearing.  HENT:     Head: Normocephalic and atraumatic.     Right Ear: Hearing, tympanic membrane, ear canal and external ear normal. No swelling or tenderness. There is no impacted cerumen. No mastoid tenderness. Tympanic membrane is not perforated, erythematous, retracted or bulging.     Left Ear: Hearing, tympanic membrane, ear canal and external ear normal. No swelling or tenderness. There is no impacted cerumen. No mastoid tenderness. Tympanic membrane is not perforated, erythematous, retracted or bulging.     Nose:     Right Sinus: No maxillary sinus tenderness or frontal sinus tenderness.     Left Sinus: No maxillary sinus tenderness or frontal sinus tenderness.     Mouth/Throat:     Mouth: Mucous membranes are moist.     Pharynx: Uvula midline. Posterior oropharyngeal erythema present. No oropharyngeal exudate.     Tonsils: No tonsillar exudate.  Cardiovascular:     Rate and Rhythm: Normal rate and regular rhythm.     Heart sounds: Normal heart sounds.  Pulmonary:     Breath sounds: Normal breath sounds and air entry. No wheezing, rhonchi or rales.  Chest:     Chest wall: No tenderness.  Abdominal:     General: Abdomen is flat. Bowel sounds are normal.     Tenderness: There is no abdominal tenderness. There is no guarding or rebound.  Lymphadenopathy:     Cervical: No cervical adenopathy.  Neurological:     General: No focal deficit present.     Mental Status: She is alert and oriented to person, place, and time.  Psychiatric:        Attention and Perception: Attention and perception normal.        Mood and Affect: Mood and affect normal.        Behavior: Behavior normal. Behavior is cooperative.        Thought Content: Thought content normal.        Judgment: Judgment normal.      UC Treatments / Results  Labs (all labs ordered are listed, but only abnormal results are displayed) Labs  Reviewed  RESP PANEL BY RT-PCR (FLU A&B, COVID) [id number]    EKG   Radiology No results found.  Procedures Procedures (including critical care time)  Medications Ordered in UC Medications - No data to display  Initial Impression / Assessment and Plan / UC Course  I have reviewed the triage vital signs and the nursing notes.  Pertinent labs & imaging results that were available during my care of the patient were reviewed by me and considered in my medical decision making (see chart for details).     We are currently awaiting result of your PCR covid-19 test. This typically comes back in 1-2 days. We'll call you if the result is positive. Otherwise, the result will be sent electronically to your MyChart. You can also call this clinic and ask for your result via telephone.   Covid and influenza tests sent today. Patient is not vaccinated for covid-19. Isolation precautions per CDC guidelines until negative result. Symptomatic relief with OTC Mucinex, Nyquil, etc. Return precautions- new/worsening fevers/chills, shortness of breath, chest pain, abd pain, etc.   Final Clinical Impressions(s) / UC Diagnoses   Final diagnoses:  Suspected COVID-19 virus infection     Discharge Instructions     -For fevers/chills, body aches, headaches- use Tylenol and Ibuprofen. You can alternate these for maximum effect. Use up to 3000mg  Tylenol daily and 3200mg  Ibuprofen daily. Make sure to take ibuprofen with food. Check the bottle of ibuprofen/tylenol for specific dosage instructions.  We are currently awaiting result of your PCR covid-19 test. This typically comes back in 1-2 days. We'll call you if the result is positive. Otherwise, the result will be sent electronically to your MyChart. You can also call this clinic and ask for your result via telephone.   Please isolate at home while awaiting these results. If your test is positive for Covid-19, continue to isolate at home for 5 days if you have  mild symptoms, or a total of 10 days from symptom onset if you have more severe symptoms. If you quarantine for a shorter period of time (i.e. 5 days), make sure to wear a mask until day 10 of symptoms. Treat your symptoms at home with OTC remedies like tylenol/ibuprofen, mucinex, nyquil, etc. Seek medical attention if you develop high fevers, chest pain, shortness of breath, ear pain, facial pain, etc. Make sure to get up and move around every 2-3 hours while convalescing to help prevent blood clots. Drink plenty of fluids, and rest as much as possible.     ED Prescriptions    None     PDMP not reviewed this encounter.   , PA-C 04/04/20 2108

## 2020-04-17 ENCOUNTER — Encounter: Payer: Self-pay | Admitting: Obstetrics and Gynecology

## 2020-04-17 NOTE — Progress Notes (Signed)
Patient ID: Janet Green, female   DOB: 19-May-2000, 20 y.o.   MRN: [id number]  Reason for Consult: Follow-up (Follow up on a STD and left breast pain x 1 week)   Referred by No ref. provider found  Subjective:     HPI:  Janet Green is a 20 y.o. female. She is here for TOC from recent STD and left breast which has been rpesent for 1 week.  Past Medical History:  Diagnosis Date  . IBS (irritable bowel syndrome)   . 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 Ruptured ovarian cyst 2019   Family History  Problem Relation Age of Onset  . Cancer Neg Hx   . Diabetes Neg Hx   . Hypertension Neg Hx   . Stroke Neg Hx   . Thyroid disease Neg Hx    Past Surgical History:  Procedure Laterality Date  . Mirena IUD  05/2018  . WISDOM TOOTH EXTRACTION      Short Social History:  Social History   Tobacco Use  . Smoking status: Never Smoker  . Smokeless tobacco: Never Used  Substance Use Topics  . Alcohol use: No    No Known Allergies  Current Outpatient Medications  Medication Sig Dispense Refill  . doxycycline (VIBRAMYCIN) 100 MG capsule Take 100 mg by mouth 2 (two) times daily.    Marland Kitchen levonorgestrel (MIRENA) 20 MCG/24HR IUD 1 each by Intrauterine route once.    . nitrofurantoin, macrocrystal-monohydrate, (MACROBID) 100 MG capsule Take 100 mg by mouth 2 (two) times daily.     No current facility-administered medications for this visit.    Review of Systems  Constitutional: Negative for chills, fatigue, fever and unexpected weight change.  HENT: Negative for trouble swallowing.  Eyes: Negative for loss of vision.  Respiratory: Negative for cough, shortness of breath and wheezing.  Cardiovascular: Negative for chest pain, leg swelling, palpitations and syncope.  GI: Negative for abdominal pain, blood in stool, diarrhea, nausea and vomiting.  GU: Negative for difficulty urinating, dysuria, frequency and hematuria.  Musculoskeletal: Negative for back pain, leg pain and joint pain.   Skin: Negative for rash.  Neurological: Negative for dizziness, headaches, light-headedness, numbness and seizures.  Psychiatric: Negative for behavioral problem, confusion, depressed mood and sleep disturbance.        Objective:  Objective   Vitals:   03/14/20 1508  BP: 112/70  Weight: 115 lb 3.2 oz (52.3 kg)  Height: 5\' 7"  (1.702 m)   Body mass index is 18.04 kg/m.  Physical Exam Vitals and nursing note reviewed. Exam conducted with a chaperone present.  Constitutional:      Appearance: She is well-developed and well-nourished.  HENT:     Head: Normocephalic and atraumatic.  Eyes:     Extraocular Movements: EOM normal.     Pupils: Pupils are equal, round, and reactive to light.  Cardiovascular:     Rate and Rhythm: Normal rate and regular rhythm.  Pulmonary:     Effort: Pulmonary effort is normal. No respiratory distress.  Chest:  Breasts:     Right: Normal. No swelling, bleeding, inverted nipple, mass, nipple discharge, skin change or tenderness.     Left: Normal. No swelling, bleeding, inverted nipple, mass, nipple discharge, skin change or tenderness.    Skin:    General: Skin is warm and dry.  Neurological:     Mental Status: She is alert and oriented to person, place, and time.  Psychiatric:        Mood and  Affect: Mood and affect normal.        Behavior: Behavior normal.        Thought Content: Thought content normal.        Judgment: Judgment normal.     Assessment/Plan:     20 yo  1. STD testing- labs sent 2. Breast pain- discussed supportive care. Follow up if pain continues or symptoms change or worsen.   More than 15 minutes were spent face to face with the patient in the room, reviewing the medical record, labs and images, and coordinating care for the patient. The plan of management was discussed in detail and counseling was provided.    Adelene Idler MD [facility], [facility] 04/17/2020 3:47 PM

## 2021-01-26 ENCOUNTER — Ambulatory Visit: Payer: Medicaid Other | Admitting: Obstetrics and Gynecology

## 2021-02-13 ENCOUNTER — Other Ambulatory Visit: Payer: Self-pay

## 2021-02-13 ENCOUNTER — Ambulatory Visit: Payer: Self-pay | Admitting: Obstetrics and Gynecology

## 2021-03-20 DIAGNOSIS — R509 Fever, unspecified: Secondary | ICD-10-CM | POA: Diagnosis not present

## 2021-03-20 DIAGNOSIS — R059 Cough, unspecified: Secondary | ICD-10-CM | POA: Diagnosis not present

## 2021-05-02 ENCOUNTER — Ambulatory Visit
Admission: EM | Admit: 2021-05-02 | Discharge: 2021-05-02 | Disposition: A | Discharge status: Home / Self Care | Payer: Medicaid Other | Attending: Emergency Medicine | Admitting: Emergency Medicine

## 2021-05-02 ENCOUNTER — Other Ambulatory Visit: Payer: Self-pay

## 2021-05-02 DIAGNOSIS — N6321 Unspecified lump in the left breast, upper outer quadrant: Secondary | ICD-10-CM

## 2021-05-02 DIAGNOSIS — N760 Acute vaginitis: Secondary | ICD-10-CM

## 2021-05-02 DIAGNOSIS — B3731 Acute candidiasis of vulva and vagina: Secondary | ICD-10-CM

## 2021-05-02 DIAGNOSIS — N6311 Unspecified lump in the right breast, upper outer quadrant: Secondary | ICD-10-CM

## 2021-05-02 DIAGNOSIS — B9689 Other specified bacterial agents as the cause of diseases classified elsewhere: Secondary | ICD-10-CM | POA: Diagnosis not present

## 2021-05-02 DIAGNOSIS — N63 Unspecified lump in unspecified breast: Secondary | ICD-10-CM

## 2021-05-02 LAB — WET PREP, GENITAL
Sperm: NONE SEEN
Trich, Wet Prep: NONE SEEN
WBC, Wet Prep HPF POC: <10 — AB (ref <10)

## 2021-05-02 MED ORDER — DOXYCYCLINE HYCLATE 100 MG PO CAPS: 100.0000 mg | ORAL_CAPSULE | Freq: Two times a day (BID) | ORAL | 0 refills | Status: DC

## 2021-05-02 MED ORDER — FLUCONAZOLE 150 MG PO TABS: 150.0000 mg | ORAL_TABLET | Freq: Every day | ORAL | 1 refills | Status: AC

## 2021-05-02 MED ORDER — METRONIDAZOLE 500 MG PO TABS: 500.0000 mg | ORAL_TABLET | Freq: Two times a day (BID) | ORAL | 0 refills | Status: DC

## 2021-05-02 NOTE — Discharge Instructions (Addendum)
Take the Flagyl (metronidazole) 500 mg twice daily for treatment of your bacterial vaginosis.  Avoid alcohol while on the metronidazole as taken together will cause of vomiting.  Bacterial vaginosis is often caused by a imbalance of bacteria in your vaginal vault.  This is sometimes a result of using tampons or hormonal fluctuations during her menstrual cycle.  You if your symptoms are recurrent you can try using a boric acid suppository twice weekly to help maintain the acid-base balance in your vagina vault which could prevent further infection.  You can also try vaginal probiotics to help return normal bacterial balance.   For your vaginal yeast infection take 1 Diflucan tablet now and repeat in 1 week as needed.  For your breast lumps and the green nipple discharge I will treat you with doxycycline twice daily for 10 days.  Take this with food.  I have also referred you to the [facility] for further evaluation of the lumps in both of your breast.  Keep your appointment with your new PCP in May as scheduled.

## 2021-05-02 NOTE — ED Triage Notes (Addendum)
Pt noticed lump in L breast approx 6 days ago.  Is same size (approx golf ball size per pt) but has now become tender to touch and with certain movement.  Also now has small lump on R breast but it is not tender.  Pt reports green discharge from nipples bilaterally x 6 days.  No redness, not hot to touch, no fevers.   Pt also concerned for BV d/t vaginal odor.  Her partner is + for BV.

## 2021-05-02 NOTE — ED Provider Notes (Signed)
[facility]    CSN: [id number] Arrival date & time: 05/02/21  1141      History   Chief Complaint Chief Complaint  Patient presents with   Breast Problem    HPI Janet Green is a 21 y.o. female.   HPI  21 year old female here for evaluation of breast issues.  Patient reports that 6 days ago she noticed a lump in the outside of her left breast that she reports is tender to the touch and with certain movements.  She also noticed a small lump on the right breast in the same vicinity without any tenderness.  She is also been experiencing green discharge from both nipples for the past 6 days.  She denies any fever, redness to the skin, malaise, nausea, or vomiting.  She states she is also been experiencing a BV like vaginal odor and she is concerned she may have BV as her partner currently has BV.  She denies any vaginal discharge.  Patient does have a family history of breast cancer in her paternal grandmother but she does not have a great amount of detail as to the onset.  The patient does report that her paternal grandmother passed away in her 85s.  She does not have a good relationship with her father but lives with her aunt who is her paternal aunt and she does not have breast cancer.  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 Authorizing Provider  doxycycline (VIBRAMYCIN) 100 MG capsule Take 1 capsule (100 mg total) by mouth 2 (two) times daily. 05/02/21  Yes Becky Augusta, NP  fluconazole (DIFLUCAN) 150 MG tablet Take 1 tablet (150 mg total) by mouth daily for 2 doses. 05/02/21 05/04/21 Yes Becky Augusta, NP  levonorgestrel (MIRENA) 20 MCG/24HR IUD 1 each by Intrauterine route once.   Yes [provider]  metroNIDAZOLE (FLAGYL) 500 MG tablet Take 1 tablet (500 mg total) by mouth 2 (two) times daily. 05/02/21  Yes Becky Augusta, NP    Family History Family History  Problem Relation Age of Onset   Cancer Paternal Grandmother    Diabetes Neg Hx    Hypertension Neg Hx    Stroke Neg Hx    Thyroid disease Neg Hx     Social History Social History   Tobacco Use   Smoking status: Never   Smokeless tobacco: Never  Vaping Use   Vaping Use: Every day   Substances: Nicotine, Flavoring  Substance Use Topics   Alcohol use: No   Drug use:  Yes    Frequency: 7.0 times per week    Types: Marijuana     Allergies   Patient has no known allergies.   Review of Systems Review of Systems  Constitutional:  Negative for activity change, appetite change, fatigue and fever.  Skin:  Negative for color change.  Hematological: Negative.   Psychiatric/Behavioral: Negative.      Physical Exam Triage Vital Signs ED Triage Vitals  Enc Vitals Group     BP 05/02/21 1153 113/67     Pulse Rate 05/02/21 1153 83     Resp 05/02/21 1153 18     Temp 05/02/21 1153 98.2 F (36.8 C)     Temp Source 05/02/21 1153 Oral     SpO2 05/02/21 1153 100 %     Weight --      Height --      Head Circumference --      Peak Flow --      Pain Score 05/02/21 1156 0     Pain Loc --      Pain Edu? --      Excl. in GC? --    No data found.  Updated Vital Signs BP 113/67 (BP Location: Left Arm)    Pulse 83    Temp 98.2 F (36.8 C) (Oral)    Resp 18    SpO2 100%   Visual Acuity Right Eye Distance:    Left Eye Distance:   Bilateral Distance:    Right Eye Near:   Left Eye Near:    Bilateral Near:     Physical Exam Vitals and nursing note reviewed. Exam conducted with a chaperone present Doyne Keel(Chanda, RN).  Constitutional:      General: She is not in acute distress.    Appearance: Normal appearance. She is not ill-appearing.  HENT:     Head: Normocephalic and atraumatic.  Cardiovascular:     Rate and Rhythm: Normal rate and regular rhythm.     Pulses: Normal pulses.     Heart sounds: Normal heart sounds. No murmur heard.   No friction rub. No gallop.  Pulmonary:     Effort: Pulmonary effort is normal.     Breath sounds: Normal breath sounds. No wheezing, rhonchi or rales.  Skin:    Capillary Refill: Capillary refill takes less than 2 seconds.     Findings: No erythema.  Neurological:     General: No focal deficit present.     Mental Status: She is alert and oriented to person, place, and time.  Psychiatric:        Mood and Affect: Mood normal.        Behavior: Behavior normal.        Thought Content: Thought content normal.        Judgment: Judgment normal.     UC Treatments / Results  Labs (all labs ordered are listed, but only abnormal results are displayed) Labs Reviewed  WET PREP, GENITAL - Abnormal; Notable for the following components:      Result Value   Yeast Wet Prep HPF POC PRESENT (*)    Clue Cells Wet Prep HPF POC PRESENT (*)    WBC, Wet Prep HPF POC <10 (*)    All other components within normal limits    EKG   Radiology No results found.  Procedures Procedures (including critical care time)  Medications Ordered in UC Medications - No data to display  Initial Impression / Assessment and Plan / UC Course  I have  reviewed the triage vital signs and the nursing notes.  Pertinent labs & imaging results that were available during my care of the patient were reviewed by me and considered in my medical decision making (see chart for  details).  Nontoxic-appearing 21 year old female here for evaluation of bilateral breast lumps and green nipple discharge that were present for the past 6 days.  The lump in the left breast is tender and larger than the one on the right.  Both of them already upper outer quadrant of the breast near the tail in the axilla.  Patient physical exam reveals a benign cardiopulmonary exam with clear lung sounds in all fields.  Breast exam performed with Chanda the RN for the unit as chaperone.  Patient's breasts are symmetrical.  There is a marble sized area of swelling that is tender in the tail of the breast near the axilla with no overlying erythema of the skin when examining the left breast.  No other lumps or tender areas identified with palpation of the remainder of the breast.  No green nipple discharge expressed from the nipple.  No axillary end adenopathy appreciated on exam.  The right breast is similar in appearance and there is a pea size nodule in the same location as on the left.  Likewise there are no other nodules appreciated on exam of the breast and no discharge from the nipple appreciated.  No axillary adenopathy.  Etiology is unclear as it may be a plugged duct.  Will place patient on doxycycline to give broad coverage and refer her to the breast center for further evaluation.  We will also collect a wet prep to look for the presence of BV given that patient is experiencing a BV like vaginal odor though she is not having any discharge.  Wet prep shows the presence of yeast and clue cells.  We will discharge patient home with a diagnosis of vaginal yeast infection as well as bacterial vaginosis.  I will treat her bacterial vaginosis with metronidazole twice daily for 7 days and to use infection with Diflucan 1 tablet now and repeat in 7 days as needed.  For her breast discharge and Ernie Hew put her on doxycycline twice daily by mouth for 10 days and refer her to the breast center for further  evaluation and management.   Final Clinical Impressions(s) / UC Diagnoses   Final diagnoses:  None     Discharge Instructions      Take the Flagyl (metronidazole) 500 mg twice daily for treatment of your bacterial vaginosis.  Avoid alcohol while on the metronidazole as taken together will cause of vomiting.  Bacterial vaginosis is often caused by a imbalance of bacteria in your vaginal vault.  This is sometimes a result of using tampons or hormonal fluctuations during her menstrual cycle.  You if your symptoms are recurrent you can try using a boric acid suppository twice weekly to help maintain the acid-base balance in your vagina vault which could prevent further infection.  You can also try vaginal probiotics to help return normal bacterial balance.   For your vaginal yeast infection take 1 Diflucan tablet now and repeat in 1 week as needed.  For your breast lumps and the green nipple discharge I will treat you with doxycycline twice daily for 10 days.  Take this with food.  I have also referred you to the [facility] for further evaluation of the lumps in both of your breast.  Keep your appointment with your new  PCP in May as scheduled.     ED Prescriptions     Medication Sig Dispense Auth. Provider   doxycycline (VIBRAMYCIN) 100 MG capsule Take 1 capsule (100 mg total) by mouth 2 (two) times daily. 20 capsule Becky Augustayan, Patrena Santalucia, NP   metroNIDAZOLE (FLAGYL) 500 MG tablet Take 1 tablet (500 mg total) by mouth 2 (two) times daily. 14 tablet Becky Augustayan, Rani Idler, NP   fluconazole (DIFLUCAN) 150 MG tablet Take 1 tablet (150 mg total) by mouth daily for 2 doses. 2 tablet Becky Augustayan, Amora Sheehy, NP      PDMP not reviewed this encounter.   Becky Augustayan, Jozette Castrellon, NP 05/02/21 1315

## 2021-05-07 ENCOUNTER — Telehealth: Payer: Self-pay | Admitting: Emergency Medicine

## 2021-05-07 NOTE — Telephone Encounter (Signed)
Patient called requesting a new referral be placed to the normal breast center as she was told that the previous referral had expired.  New referral sent for evaluation of bilateral breast lumps and nipple discharge.

## 2021-05-11 DIAGNOSIS — N6311 Unspecified lump in the right breast, upper outer quadrant: Secondary | ICD-10-CM | POA: Diagnosis not present

## 2021-05-11 DIAGNOSIS — N6321 Unspecified lump in the left breast, upper outer quadrant: Secondary | ICD-10-CM | POA: Diagnosis not present

## 2021-05-15 ENCOUNTER — Other Ambulatory Visit: Payer: Self-pay | Admitting: Physician Assistant

## 2021-05-15 ENCOUNTER — Other Ambulatory Visit: Payer: Self-pay | Admitting: Dermatology

## 2021-05-15 DIAGNOSIS — N6321 Unspecified lump in the left breast, upper outer quadrant: Secondary | ICD-10-CM

## 2021-05-15 DIAGNOSIS — N6310 Unspecified lump in the right breast, unspecified quadrant: Secondary | ICD-10-CM

## 2021-05-16 ENCOUNTER — Other Ambulatory Visit: Payer: Self-pay

## 2021-05-16 ENCOUNTER — Ambulatory Visit
Admission: RE | Admit: 2021-05-16 | Discharge: 2021-05-16 | Disposition: A | Discharge status: Home / Self Care | Payer: Medicaid Other | Source: Ambulatory Visit | Attending: Physician Assistant | Admitting: Physician Assistant

## 2021-05-16 DIAGNOSIS — N6321 Unspecified lump in the left breast, upper outer quadrant: Secondary | ICD-10-CM

## 2021-05-16 DIAGNOSIS — N6311 Unspecified lump in the right breast, upper outer quadrant: Secondary | ICD-10-CM | POA: Diagnosis not present

## 2021-05-16 DIAGNOSIS — N6310 Unspecified lump in the right breast, unspecified quadrant: Secondary | ICD-10-CM | POA: Insufficient documentation

## 2021-05-16 DIAGNOSIS — N6489 Other specified disorders of breast: Secondary | ICD-10-CM | POA: Diagnosis not present

## 2021-06-11 DIAGNOSIS — Z111 Encounter for screening for respiratory tuberculosis: Secondary | ICD-10-CM | POA: Diagnosis not present

## 2021-06-11 DIAGNOSIS — Z23 Encounter for immunization: Secondary | ICD-10-CM | POA: Diagnosis not present

## 2021-06-14 DIAGNOSIS — Z111 Encounter for screening for respiratory tuberculosis: Secondary | ICD-10-CM | POA: Diagnosis not present

## 2021-07-31 ENCOUNTER — Other Ambulatory Visit (HOSPITAL_COMMUNITY)
Admission: RE | Admit: 2021-07-31 | Discharge: 2021-07-31 | Disposition: A | Discharge status: Home / Self Care | Payer: Medicaid Other | Source: Ambulatory Visit | Attending: Internal Medicine | Admitting: Internal Medicine

## 2021-07-31 ENCOUNTER — Encounter: Payer: Self-pay | Admitting: Internal Medicine

## 2021-07-31 ENCOUNTER — Ambulatory Visit: Payer: Medicaid Other | Admitting: Internal Medicine

## 2021-07-31 VITALS — BP 96/60 | HR 78 | Temp 97.3°F | Ht 67.0 in | Wt 132.0 lb

## 2021-07-31 DIAGNOSIS — Z124 Encounter for screening for malignant neoplasm of cervix: Secondary | ICD-10-CM

## 2021-07-31 DIAGNOSIS — K581 Irritable bowel syndrome with constipation: Secondary | ICD-10-CM | POA: Diagnosis not present

## 2021-07-31 DIAGNOSIS — Z0001 Encounter for general adult medical examination with abnormal findings: Secondary | ICD-10-CM | POA: Diagnosis not present

## 2021-07-31 DIAGNOSIS — Z113 Encounter for screening for infections with a predominantly sexual mode of transmission: Secondary | ICD-10-CM | POA: Diagnosis not present

## 2021-07-31 DIAGNOSIS — B079 Viral wart, unspecified: Secondary | ICD-10-CM

## 2021-07-31 DIAGNOSIS — K589 Irritable bowel syndrome without diarrhea: Secondary | ICD-10-CM | POA: Insufficient documentation

## 2021-07-31 NOTE — Patient Instructions (Signed)

## 2021-07-31 NOTE — Assessment & Plan Note (Signed)
Diet controlled ?Encouraged high-fiber diet and adequate water intake ?

## 2021-07-31 NOTE — Progress Notes (Signed)
HPI ? ?Pt presents to the clinic today to establish care and for management of the conditions  ?IBS: She reports intermittent constipation. She does not take any medication for this.  ? ?She does have a skin lesion of her left thumb. She noticed this 1 year ago. She has tried Compound W OTC with minimal relief of symptoms. ? ?Flu: 06/2021 ?Tetanus: Unsure ?Covid: Pfizer x 2 ?Pap smear: never ?Dentist: biannually ? ?Diet: She does eat meat. She consumes fruits and veggies. She does eat some fried foods. She drinks mostly water. ?Exercise: None ? ?Past Medical History:  ?Diagnosis Date  ? IBS (irritable bowel syndrome)   ? 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 Ruptured ovarian cyst 2019  ? ? ?Current Outpatient Medications  ?Medication Sig Dispense Refill  ? doxycycline (VIBRAMYCIN) 100 MG capsule Take 1 capsule (100 mg total) by mouth 2 (two) times daily. 20 capsule 0  ? levonorgestrel (MIRENA) 20 MCG/24HR IUD 1 each by Intrauterine route once.    ? metroNIDAZOLE (FLAGYL) 500 MG tablet Take 1 tablet (500 mg total) by mouth 2 (two) times daily. 14 tablet 0  ? ?No current facility-administered medications for this visit.  ? ? ?No Known Allergies ? ?Family History  ?Problem Relation Age of Onset  ? Cancer Paternal Grandmother   ? Diabetes Neg Hx   ? Hypertension Neg Hx   ? Stroke Neg Hx   ? Thyroid disease Neg Hx   ? ? ?Social History  ? ?Socioeconomic History  ? Marital status: Single  ?  Spouse name: Not on file  ? Number of children: Not on file  ? Years of education: Not on file  ? Highest education level: Not on file  ?Occupational History  ? Not on file  ?Tobacco Use  ? Smoking status: Never  ? Smokeless tobacco: Never  ?Vaping Use  ? Vaping Use: Every day  ? Substances: Nicotine, Flavoring  ?Substance and Sexual Activity  ? Alcohol use: No  ? Drug use: Yes  ?  Frequency: 7.0 times per week  ?  Types: Marijuana  ? Sexual activity: Yes  ?  Birth control/protection: I.U.D., None  ?Other Topics Concern  ? Not on file   ?Social History Narrative  ? Not on file  ? ?Social Determinants of Health  ? ?Financial Resource Strain: Not on file  ?Food Insecurity: Not on file  ?Transportation Needs: Not on file  ?Physical Activity: Not on file  ?Stress: Not on file  ?Social Connections: Not on file  ?Intimate Partner Violence: Not on file  ? ? ?ROS: ? ?Constitutional: Denies fever, malaise, fatigue, headache or abrupt weight changes.  ?HEENT: Denies eye pain, eye redness, ear pain, ringing in the ears, wax buildup, runny nose, nasal congestion, bloody nose, or sore throat. ?Respiratory: Denies difficulty breathing, shortness of breath, cough or sputum production.   ?Cardiovascular: Denies chest pain, chest tightness, palpitations or swelling in the hands or feet.  ?Gastrointestinal: Denies abdominal pain, bloating, constipation, diarrhea or blood in the stool.  ?GU: Denies frequency, urgency, pain with urination, blood in urine, odor or discharge. ?Musculoskeletal: Denies decrease in range of motion, difficulty with gait, muscle pain or joint pain and swelling.  ?Skin: Pt reports wart of left thumb. Denies redness, rashes, or ulcercations.  ?Neurological: Denies dizziness, difficulty with memory, difficulty with speech or problems with balance and coordination.  ?Psych: Denies anxiety, depression, SI/HI. ? ?No other specific complaints in a complete review of systems (except as listed in HPI above). ? ?  PE: ? ?BP 96/60 (BP Location: Left Arm, Patient Position: Sitting, Cuff Size: Normal)   Pulse 78   Temp (!) 97.3 ?F (36.3 ?C) (Temporal)   Ht 5\' 7"  (1.702 m)   Wt 132 lb (59.9 kg)   SpO2 99%   BMI 20.67 kg/m?  ? ?Wt Readings from Last 3 Encounters:  ?03/14/20 115 lb 3.2 oz (52.3 kg)  ?01/12/20 123 lb 6.4 oz (56 kg)  ?07/19/19 129 lb (58.5 kg)  ? ? ?General: Appears her stated age, well developed, well nourished in NAD. ?HEENT: Head: normal shape and size; Eyes: sclera white, no icterus, conjunctiva pink, PERRLA and EOMs intact;  ?Neck:  Neck supple, trachea midline. No masses, lumps or thyromegaly present.  ?Cardiovascular: Normal rate and rhythm. S1,S2 noted.  No murmur, rubs or gallops noted. No JVD or BLE edema.  ?Pulmonary/Chest: Normal effort and positive vesicular breath sounds. No respiratory distress. No wheezes, rales or ronchi noted.  ?Abdomen: Soft and nontender. Normal bowel sounds. ?Pelvic: Normal female anatomy. Cervix without mass or lesion. IUD strings noted. No CMT. Adnexa non palpable. ?Musculoskeletal: Strength 5/5 BUE/BLE. No difficulty with gait.  ?Neurological: Alert and oriented. Cranial nerves II-XII grossly intact. Coordination normal.  ?Psychiatric: Mood and affect normal. Behavior is normal. Judgment and thought content normal.  ? ? ?BMET ?   ?Component Value Date/Time  ? NA 137 10/05/2017 2246  ? K 3.5 10/05/2017 2246  ? CL 104 10/05/2017 2246  ? CO2 23 10/05/2017 2246  ? GLUCOSE 88 10/05/2017 2246  ? BUN 9 10/05/2017 2246  ? CREATININE 0.58 10/05/2017 2246  ? CALCIUM 9.7 10/05/2017 2246  ? GFRNONAA >60 10/05/2017 2246  ? GFRAA >60 10/05/2017 2246  ? ? ?Lipid Panel  ?No results found for: CHOL, TRIG, HDL, CHOLHDL, VLDL, LDLCALC ? ?CBC ?   ?Component Value Date/Time  ? WBC 7.5 07/19/2019 0920  ? WBC 15.2 (H) 05/27/2018 0610  ? RBC 4.18 07/19/2019 0920  ? RBC 3.78 (L) 05/27/2018 0610  ? HGB 12.9 07/19/2019 0920  ? HCT 38.4 07/19/2019 0920  ? PLT 264 05/27/2018 0610  ? PLT 282 03/04/2018 0938  ? MCV 92 07/19/2019 0920  ? MCH 30.9 07/19/2019 0920  ? MCH 24.6 (L) 05/27/2018 0610  ? MCHC 33.6 07/19/2019 0920  ? MCHC 30.0 05/27/2018 0610  ? RDW 12.7 07/19/2019 0920  ? LYMPHSABS 2.2 07/19/2019 0920  ? EOSABS 0.0 07/19/2019 0920  ? BASOSABS 0.0 07/19/2019 0920  ? ? ?Hgb A1C ?No results found for: HGBA1C ? ? ?Assessment and Plan: ? ?Preventative Health Maintenance: ? ?Encouraged her to get a flu shot in the fall ?She declines tetanus ?Encouraged her to get her covid booster ?Pap smear today with STD screening ?Encouraged her to  consume a balanced diet and exercise regimen ?Advised her to see a dentist annually ?Will check CBC, CMET, Lipid, HIV and Hep C today ? ?Wart of Left Thumb: ? ?Referral to dermatology for treatment ? ?RTC in 1 year, sooner if needed ? ?Webb Silversmith, NP ? ?

## 2021-08-01 LAB — COMPLETE METABOLIC PANEL WITH GFR
AG Ratio: 2 (calc) (ref 1.0–2.5)
ALT: 18 U/L (ref 6–29)
AST: 14 U/L (ref 10–30)
Albumin: 4.9 g/dL (ref 3.6–5.1)
Alkaline phosphatase (APISO): 58 U/L (ref 31–125)
BUN: 15 mg/dL (ref 7–25)
CO2: 27 mmol/L (ref 20–32)
Calcium: 9.6 mg/dL (ref 8.6–10.2)
Chloride: 104 mmol/L (ref 98–110)
Creat: 0.63 mg/dL (ref 0.50–0.96)
Globulin: 2.5 g/dL (calc) (ref 1.9–3.7)
Glucose, Bld: 82 mg/dL (ref 65–99)
Potassium: 4.1 mmol/L (ref 3.5–5.3)
Sodium: 139 mmol/L (ref 135–146)
Total Bilirubin: 0.5 mg/dL (ref 0.2–1.2)
Total Protein: 7.4 g/dL (ref 6.1–8.1)
eGFR: 129 mL/min/{1.73_m2} (ref >=60)

## 2021-08-01 LAB — CBC
HCT: 41.5 % (ref 35.0–45.0)
Hemoglobin: 14.2 g/dL (ref 11.7–15.5)
MCH: 33 pg (ref 27.0–33.0)
MCHC: 34.2 g/dL (ref 32.0–36.0)
MCV: 96.5 fL (ref 80.0–100.0)
MPV: 9.9 fL (ref 7.5–12.5)
Platelets: 219 10*3/uL (ref 140–400)
RBC: 4.3 10*6/uL (ref 3.80–5.10)
RDW: 11.8 % (ref 11.0–15.0)
WBC: 5.2 10*3/uL (ref 3.8–10.8)

## 2021-08-01 LAB — HIV ANTIBODY (ROUTINE TESTING W REFLEX): HIV 1&2 Ab, 4th Generation: NONREACTIVE

## 2021-08-01 LAB — CERVICOVAGINAL ANCILLARY ONLY
Bacterial Vaginitis (gardnerella): POSITIVE — AB
Candida Glabrata: NEGATIVE
Candida Vaginitis: NEGATIVE
Chlamydia: NEGATIVE
Comment: NEGATIVE
Comment: NEGATIVE
Comment: NEGATIVE
Comment: NEGATIVE
Comment: NEGATIVE
Comment: NORMAL
Neisseria Gonorrhea: NEGATIVE
Trichomonas: NEGATIVE

## 2021-08-01 LAB — HEPATITIS C ANTIBODY
Hepatitis C Ab: NONREACTIVE
SIGNAL TO CUT-OFF: 0.06 (ref <1.00)

## 2021-08-01 LAB — LIPID PANEL
Cholesterol: 185 mg/dL (ref <200)
HDL: 82 mg/dL (ref >=50)
LDL Cholesterol (Calc): 90 mg/dL (calc)
Non-HDL Cholesterol (Calc): 103 mg/dL (calc) (ref <130)
Total CHOL/HDL Ratio: 2.3 (calc) (ref <5.0)
Triglycerides: 44 mg/dL (ref <150)

## 2021-08-02 LAB — CYTOLOGY - PAP: Diagnosis: NEGATIVE

## 2021-09-21 DIAGNOSIS — R238 Other skin changes: Secondary | ICD-10-CM | POA: Diagnosis not present

## 2021-09-21 DIAGNOSIS — B078 Other viral warts: Secondary | ICD-10-CM | POA: Diagnosis not present

## 2022-05-14 ENCOUNTER — Other Ambulatory Visit (HOSPITAL_COMMUNITY)
Admission: RE | Admit: 2022-05-14 | Discharge: 2022-05-14 | Disposition: A | Discharge status: Home / Self Care | Payer: Medicaid Other | Source: Ambulatory Visit | Attending: Internal Medicine | Admitting: Internal Medicine

## 2022-05-14 ENCOUNTER — Encounter: Payer: Self-pay | Admitting: Internal Medicine

## 2022-05-14 ENCOUNTER — Ambulatory Visit: Payer: Medicaid Other | Admitting: Internal Medicine

## 2022-05-14 VITALS — BP 106/60 | HR 88 | Temp 96.8°F | Wt 129.0 lb

## 2022-05-14 DIAGNOSIS — N898 Other specified noninflammatory disorders of vagina: Secondary | ICD-10-CM

## 2022-05-14 DIAGNOSIS — R102 Pelvic and perineal pain: Secondary | ICD-10-CM | POA: Diagnosis not present

## 2022-05-14 MED ORDER — METRONIDAZOLE 500 MG PO TABS: 500.0000 mg | ORAL_TABLET | Freq: Two times a day (BID) | ORAL | 0 refills | Status: DC

## 2022-05-14 NOTE — Patient Instructions (Signed)

## 2022-05-14 NOTE — Progress Notes (Signed)
Subjective:    Patient ID: Janet Green, female    DOB: 06-20-2000, 22 y.o.   MRN: [id number]  HPI  Patient presents to clinic today with complaint of vaginal discharge and odor.  This started 3 days ago.  She reports the discharge is white/yellow.  She reports the discharge has a fishy odor.  She reports some pelvic cramping.  She denies abnormal vaginal bleeding, urinary symptoms, fever, chills, nausea or vomiting.  She has not tried anything OTC for this.  She reports her partner recently tested positive for BV.  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     No current facility-administered medications for this visit.    No Known Allergies  Family History  Problem Relation Age of Onset   Healthy Mother    Healthy Father    Healthy Sister    Healthy Sister    Healthy Brother    Breast cancer Paternal Grandmother    Diabetes Neg Hx    Hypertension Neg Hx    Stroke Neg Hx    Thyroid disease Neg Hx     Social History   Socioeconomic History   Marital status: Single    Spouse name: Not on file   Number of children: Not on file   Years of education: Not on file   Highest education level: Not on file  Occupational History   Not on file  Tobacco Use   Smoking status: Never   Smokeless tobacco: Never  Vaping Use   Vaping Use: Never used  Substance and Sexual Activity   Alcohol use: No   Drug use: Yes    Frequency: 7.0 times per week    Types: Marijuana   Sexual activity: Yes    Birth control/protection: I.U.D., None  Other Topics Concern   Not on file  Social History Narrative   Not on file   Social Determinants of Health   Financial Resource Strain: Not on file  Food Insecurity: Not on file   Transportation Needs: Not on file  Physical Activity: Not on file  Stress: Not on file  Social Connections: Not on file  Intimate Partner Violence: Not on file     Constitutional: Denies fever, malaise, fatigue, headache or abrupt weight changes.  Respiratory: Denies difficulty breathing, shortness of breath, cough or sputum production.   Cardiovascular: Denies chest pain, chest tightness, palpitations or swelling in the hands or feet.  Gastrointestinal: Patient reports pelvic cramping.  Denies bloating, constipation, diarrhea or blood in the stool.  GU: Patient reports vaginal discharge and odor.  Denies urgency, frequency, pain with urination, burning sensation, blood in urine.  No other specific complaints in a complete review of systems (except as listed in HPI above).   Objective:   Physical Exam  BP 106/60 (BP Location: Left Arm, Patient Position: Sitting, Cuff Size: Normal)   Pulse 88   Temp (!) 96.8 F (36 C) (Temporal)   Wt 129 lb (58.5 kg)   SpO2 98%   BMI 20.20 kg/m   Wt Readings from Last 3 Encounters:  07/31/21 132 lb (59.9 kg)  03/14/20 115 lb 3.2 oz (52.3 kg)  01/12/20 123 lb 6.4 oz (56 kg)    General:  Appears her stated age, well developed, well nourished in NAD. Cardiovascular: Normal rate and rhythm. Pulmonary/Chest: Normal effort and positive vesicular breath sounds. No respiratory distress. No wheezes, rales or ronchi noted.  Abdomen: Soft and nontender.  Pelvic: Self swab. Neurological: Alert and oriented.     BMET    Component Value Date/Time   NA 139 07/31/2021 1046   K 4.1 07/31/2021 1046   CL 104 07/31/2021 1046   CO2 27 07/31/2021 1046   GLUCOSE 82 07/31/2021 1046   BUN 15 07/31/2021 1046   CREATININE 0.63 07/31/2021 1046   CALCIUM 9.6 07/31/2021 1046   GFRNONAA >60 10/05/2017 2246   GFRAA >60 10/05/2017 2246    Lipid Panel     Component Value Date/Time   CHOL 185 07/31/2021 1046   TRIG 44 07/31/2021 1046   HDL 82 07/31/2021  1046   CHOLHDL 2.3 07/31/2021 1046   LDLCALC 90 07/31/2021 1046    CBC    Component Value Date/Time   WBC 5.2 07/31/2021 1046   RBC 4.30 07/31/2021 1046   HGB 14.2 07/31/2021 1046   HGB 12.9 07/19/2019 0920   HCT 41.5 07/31/2021 1046   HCT 38.4 07/19/2019 0920   PLT 219 07/31/2021 1046   PLT 282 03/04/2018 0938   MCV 96.5 07/31/2021 1046   MCV 92 07/19/2019 0920   MCH 33.0 07/31/2021 1046   MCHC 34.2 07/31/2021 1046   RDW 11.8 07/31/2021 1046   RDW 12.7 07/19/2019 0920   LYMPHSABS 2.2 07/19/2019 0920   EOSABS 0.0 07/19/2019 0920   BASOSABS 0.0 07/19/2019 0920    Hgb A1C No results found for: "HGBA1C"         Assessment & Plan:  Vaginal Discharge, Vaginal Odor, Pelvic Cramping, Exposure to BV:  Wet prep obtained Rx for Flagyl 500 mg twice daily x 7 days-avoid alcohol  RTC in 3 months for annual exam Webb Silversmith, NP

## 2022-05-16 LAB — CERVICOVAGINAL ANCILLARY ONLY
Bacterial Vaginitis (gardnerella): POSITIVE — AB
Candida Glabrata: NEGATIVE
Candida Vaginitis: NEGATIVE
Chlamydia: NEGATIVE
Comment: NEGATIVE
Comment: NEGATIVE
Comment: NEGATIVE
Comment: NEGATIVE
Comment: NEGATIVE
Comment: NORMAL
Neisseria Gonorrhea: NEGATIVE
Trichomonas: NEGATIVE

## 2022-10-16 DIAGNOSIS — H52223 Regular astigmatism, bilateral: Secondary | ICD-10-CM | POA: Diagnosis not present

## 2022-10-16 DIAGNOSIS — H5213 Myopia, bilateral: Secondary | ICD-10-CM | POA: Diagnosis not present

## 2022-12-06 ENCOUNTER — Ambulatory Visit: Payer: Self-pay

## 2022-12-06 NOTE — Telephone Encounter (Signed)
Tried calling; pt's voice mail is full.  PEC please advise if pt calls back.   Thanks,   -Vernona Rieger

## 2022-12-06 NOTE — Telephone Encounter (Addendum)
Covering inbox for Nicki Reaper, FNP  I see this patient's concern, and it looks like she is scheduled to see me on Monday 12/09/22 for this issue.  Based on the nature of the problem with umbilical hernia, Monday is fine to wait to do evaluation and advise her further. It should not be an emergency or urgent situation.  She seems to want to get in ASAP today mainly just for the work note. Which I understand, but I am not sure we can accommodate today.  If she is okay to wait until Monday 9/9 to be seen and to write a note, that would be best.  I can write her out for 1 day or the weekend if she needs an excuse to get her to Monday. I don't know if I can write detailed restrictions without evaluating her however.  ALSO - updated my note here - that she may benefit from talking to her employer's HR about worker's comp if available if the hernia issue has been caused by her job. She may want to pursue that as well instead of going through regular medical coverage. Especially if she may require a procedure or surgery in the future.  Let me know what she says  Saralyn Pilar, DO [facility] 12/06/2022, 10:01 AM

## 2022-12-06 NOTE — Telephone Encounter (Signed)
  Chief Complaint: Possible hernia Symptoms: golf ball sized lump above belly button Frequency: last night Pertinent Negatives: Patient denies fever Disposition: [] ED /[] Urgent Care (no appt availability in office) / [x] Appointment(In office/virtual)/ []  [place] [facility]/ [] Home Care/ [] Refused Recommended Disposition /[] [facility]/ []  Follow-up with PCP Additional Notes: Pt noticed lump last night. Pt works in a [employer]. Pt will need a note inorder to have light duty. Pt would like to be seen in office today so as not to lose work time. Pt is willing to go to UC.  Please work pt in for today.    Summary: Knot above belly button   Pt is calling to report that she has found a knot above her belly button. No appts at her PCP office. And no appts with floating provider. Please advise     Reason for Disposition  [1] New-onset hernia suspected (reducible bulge in groin or abdomen; non-tender) AND [2] NO pain or vomiting  Answer Assessment - Initial Assessment Questions 1. ONSET:  "When did this first appear?"     Noticed it last night 2. APPEARANCE: "What does it look like?"     Lump - can be reduced, but pops right back out. 3. SIZE: "How big is it?" (inches, cm or compare to coins, fruit)     Golf ball 4. LOCATION: "Where exactly is the hernia located?"     1 inch above belly button 5. PATTERN: "Does the swelling come and go, or has it been constant since it started?"     Noticed last night 6. PAIN: "Is there any pain?" If Yes, ask: "How bad is it?"  (Scale 1-10; or mild, moderate, severe)    - MILD (1-3): Doesn't interfere with normal activities, abdomen soft and not tender to touch.     - MODERATE (4-7): Interferes with normal activities or awakens from sleep, abdomen tender to touch.     - SEVERE (8-10): Excruciating pain, doubled over, unable to do any normal activities.       Cramp feeling 7. DIAGNOSIS: "Have you been seen by a doctor (or  NP/PA) for this?" "Did the doctor diagnose you as having a hernia?"     no 8. OTHER SYMPTOMS: "Do you have any other symptoms?" (e.g., fever, abdomen pain, vomiting)     Stomach cramps.  Protocols used: [facility]

## 2022-12-09 ENCOUNTER — Ambulatory Visit: Payer: Medicaid Other | Admitting: Family Medicine

## 2022-12-09 ENCOUNTER — Encounter: Payer: Self-pay | Admitting: Family Medicine

## 2022-12-09 VITALS — BP 106/64 | HR 84 | Temp 96.9°F | Wt 116.0 lb

## 2022-12-09 DIAGNOSIS — K429 Umbilical hernia without obstruction or gangrene: Secondary | ICD-10-CM | POA: Diagnosis not present

## 2022-12-09 NOTE — Progress Notes (Signed)
Subjective:    Patient ID: Janet Green, female    DOB: 2000/10/03, 22 y.o.   MRN: [id number]  Janet Green is a 22 y.o. female presenting on 12/09/2022 for Umbilical Hernia  PCP Nicki Reaper, FNP   HPI  Umbilical Hernia  Last week Thursday night she identified umbilical hernia concern Works overnight at [employer], she does a lot of heavier lifting up to 50 lbs, bending. No known heavy lifting injury. She says there is an uncomfortable cramping feeling associated with it She feels like it disappears when feeling bloated She has history of prior vaginal delivery pregnancy. No abdominal surgery previously.  She submitted for Medical leave / FMLA - continuous leave 9/6, 9/7, 9/8. Return on 12/13/22. Missed work last weekend. She only works Fri Sat Sun.       12/09/2022   10:51 AM 05/14/2022    9:20 AM 12/18/2017   11:56 AM  Depression screen PHQ 2/9  Decreased Interest 0 0 3  Down, Depressed, Hopeless 0 0 2  PHQ - 2 Score 0 0 5  Altered sleeping 0  3  Tired, decreased energy 2  3  Change in appetite 0  2  Feeling bad or failure about yourself  0  1  Trouble concentrating 0  1  Moving slowly or fidgety/restless 0  1  Suicidal thoughts 0  0  PHQ-9 Score 2  16  Difficult doing work/chores Not difficult at all  Somewhat difficult    Social History   Tobacco Use   Smoking status: Never   Smokeless tobacco: Never  Vaping Use   Vaping status: Never Used  Substance Use Topics   Alcohol use: No   Drug use: Yes    Frequency: 7.0 times per week    Types: Marijuana    Review of Systems Per HPI unless specifically indicated above     Objective:    BP 106/64 (BP Location: Left Arm, Patient Position: Sitting, Cuff Size: Normal)   Pulse 84   Temp (!) 96.9 F (36.1 C) (Temporal)   Wt 116 lb (52.6 kg)   SpO2 99%   BMI 18.17 kg/m   Wt Readings from Last 3 Encounters:  12/09/22 116 lb (52.6 kg)  05/14/22 129 lb (58.5 kg)  07/31/21 132 lb (59.9 kg)     Physical Exam Vitals and nursing note reviewed.  Constitutional:      General: She is not in acute distress.    Appearance: Normal appearance. She is well-developed. She is not diaphoretic.     Comments: Well-appearing, comfortable, cooperative  HENT:     Head: Normocephalic and atraumatic.  Eyes:     General:        Right eye: No discharge.        Left eye: No discharge.     Conjunctiva/sclera: Conjunctivae normal.  Cardiovascular:     Rate and Rhythm: Normal rate.  Pulmonary:     Effort: Pulmonary effort is normal.  Abdominal:     Hernia: A hernia (umbilical hernia mild approx 2 cm region without obvious herniation but palpable defect felt. also has nodular density associated with umbilicus. Deferred inguinal exam today.) is present.  Skin:    General: Skin is warm and dry.     Findings: No erythema or rash.  Neurological:     Mental Status: She is alert and oriented to person, place, and time.  Psychiatric:        Mood and Affect: Mood normal.  Behavior: Behavior normal.        Thought Content: Thought content normal.     Comments: Well groomed, good eye contact, normal speech and thoughts      Results for orders placed or performed in visit on 05/14/22  Cervicovaginal ancillary only  Result Value Ref Range   Neisseria Gonorrhea Negative    Chlamydia Negative    Trichomonas Negative    Bacterial Vaginitis (gardnerella) Positive (A)    Candida Vaginitis Negative    Candida Glabrata Negative    Comment      Normal Reference Range Bacterial Vaginosis - Negative   Comment Normal Reference Range Candida Species - Negative    Comment Normal Reference Range Candida Galbrata - Negative    Comment Normal Reference Range Trichomonas - Negative    Comment Normal Reference Ranger Chlamydia - Negative    Comment      Normal Reference Range Neisseria Gonorrhea - Negative      Assessment & Plan:   Problem List Items Addressed This Visit   None Visit Diagnoses      Umbilical hernia without obstruction and without gangrene    -  Primary   Relevant Orders   Ambulatory referral to General Surgery       Mild umbilical hernia appreciated today with fascial defect but no obvious herniation Several risk factors  She has some IBS history and abdominal straining, history of prior pregnancy. No abdominal surgery. She does heavy lifting 25-50 lbs at work and concern that lifting has provoked this issue.   She is interested to pursue surgery consult and eval if warrants repair. Concern with heavy lifting at work.  referral to General Surgery for evaluation of umbilical hernia and possible inguinal hernia.   Work note given today to resume work 12/13/22 with restrictions, lifting limit avoid >15 lbs. Okay to lift up to 15 lbs. For 4 weeks or sooner if evaluated by Gen Surgery.  She has submitted FMLA Continuous Leave for 9/6, 9/7, 9/8 days missed due to this issue. She will need this completed when we receive paperwork and return date is 12/13/22.  No orders of the defined types were placed in this encounter.  Orders Placed This Encounter  Procedures   Ambulatory referral to General Surgery    Referral Priority:   Routine    Referral Type:   Surgical    Referral Reason:   Specialty Services Required    Requested Specialty:   General Surgery    Number of Visits Requested:   1      Follow up plan: Return if symptoms worsen or fail to improve.   Saralyn Pilar, DO [facility] 12/09/2022, 11:10 AM

## 2022-12-09 NOTE — Patient Instructions (Addendum)
Thank you for coming to the office today.  Umbilical hernia and also possible inguinal,   Stay tuned for apt. If not heard back within 1 week, then call their office.  --------------  Referral sent to Dr Carolan Shiver  [facility] - General Surgery [address] [place], [place] [address] Office: [phone]  Restriction letter on MyChart.   Please schedule a Follow-up Appointment to: Return if symptoms worsen or fail to improve.  If you have any other questions or concerns, please feel free to call the office or send a message through MyChart. You may also schedule an earlier appointment if necessary.  Additionally, you may be receiving a survey about your experience at our office within a few days to 1 week by e-mail or mail. We value your feedback.  Saralyn Pilar, DO [facility], [place]

## 2022-12-13 ENCOUNTER — Telehealth: Payer: Self-pay | Admitting: Internal Medicine

## 2022-12-13 NOTE — Telephone Encounter (Signed)
Patient checking on the status of Dr. Phoebe Sharps. Advised caller letter can be sent through My Chart. Please follow up with patient regarding when letter will be uploaded to the My Chart portal so she can communicate with her employer.

## 2022-12-13 NOTE — Telephone Encounter (Signed)
Pt is calling request a return to work note with no restrictions. Pt would like to know when this is ready. Pt would like to pick up the note when this is ready. Please advise [phone]

## 2022-12-13 NOTE — Telephone Encounter (Signed)
Pt is calling in because she needs her Doctor's note addended to say she can return to work with no restrictions. Pt says it says she can return to work but it doesn't say with no restrictions. Pt says she is supposed to return to work tonight and would like if it could be edited and resent.

## 2022-12-13 NOTE — Telephone Encounter (Signed)
Okay for work note to return to work

## 2022-12-13 NOTE — Telephone Encounter (Signed)
Pt's note sent to My Chart.    Thanks,   -Vernona Rieger

## 2022-12-16 NOTE — Telephone Encounter (Signed)
Letter sent through My Chart.   Thanks,   -Vernona Rieger

## 2022-12-17 DIAGNOSIS — K429 Umbilical hernia without obstruction or gangrene: Secondary | ICD-10-CM | POA: Diagnosis not present

## 2022-12-17 DIAGNOSIS — K439 Ventral hernia without obstruction or gangrene: Secondary | ICD-10-CM | POA: Diagnosis not present

## 2022-12-18 IMAGING — US US BREAST*R* LIMITED INC AXILLA
1 series · 13 of 14 positions shown · non-contrast
Comparison: None.

CLINICAL DATA: 20-year-old with possible palpable lumps in both
breasts, 2 on the RIGHT and 1 on the LEFT.

EXAM:
ULTRASOUND OF THE BILATERAL BREASTS

[Series 1: us breast*right* limited inc axilla · 0.06mm/px · 13 of 14 slices shown]
[im 1/14]
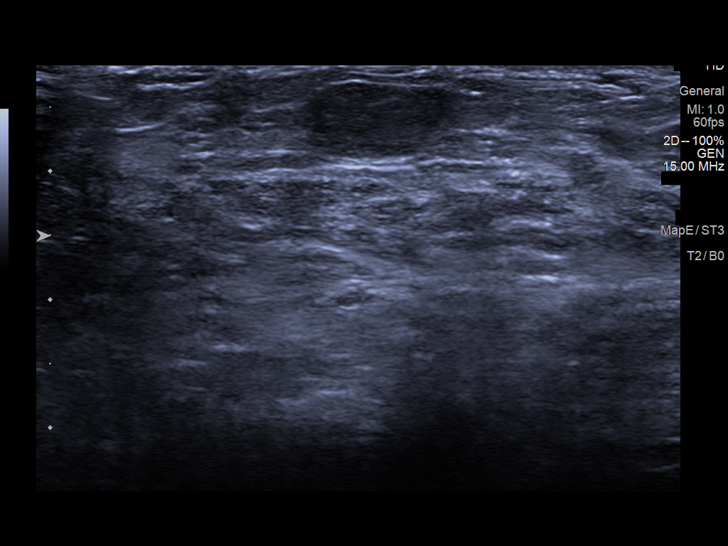
[im 2/14]
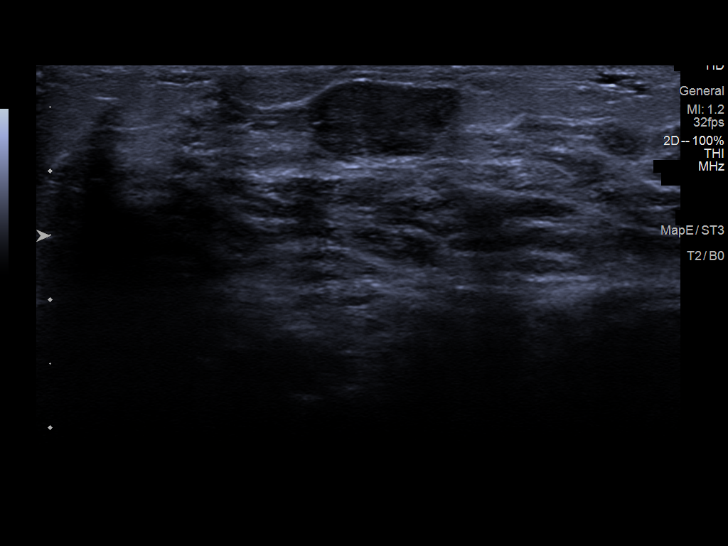
[im 3/14]
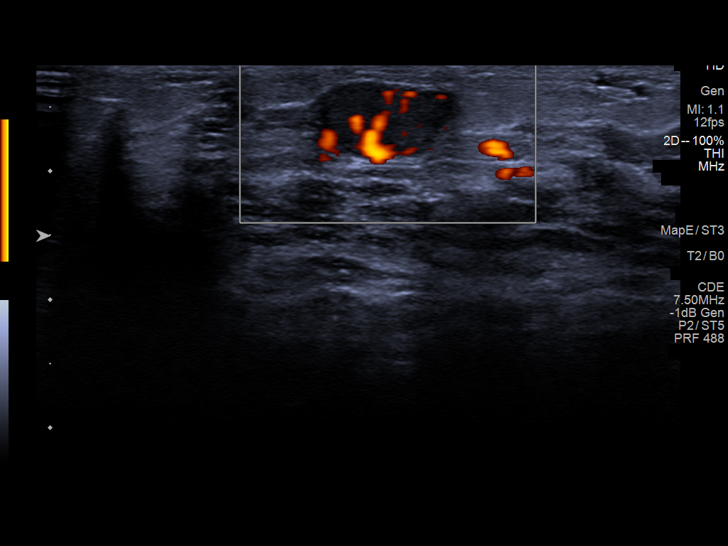
[im 4/14]
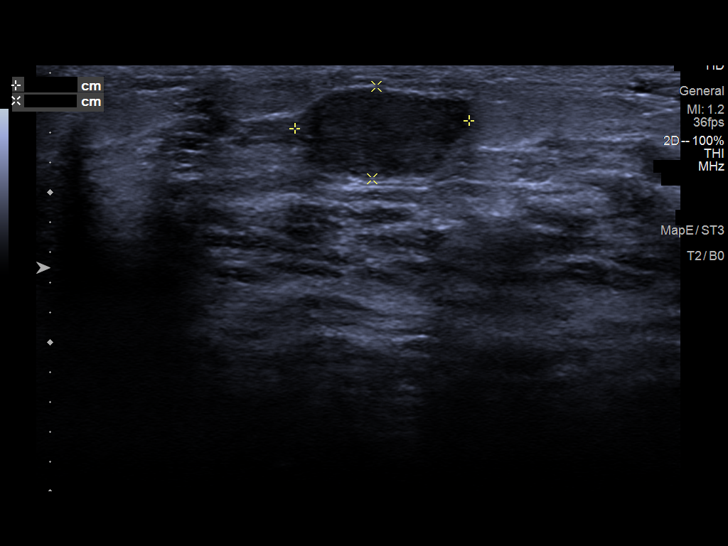
[im 5/14]
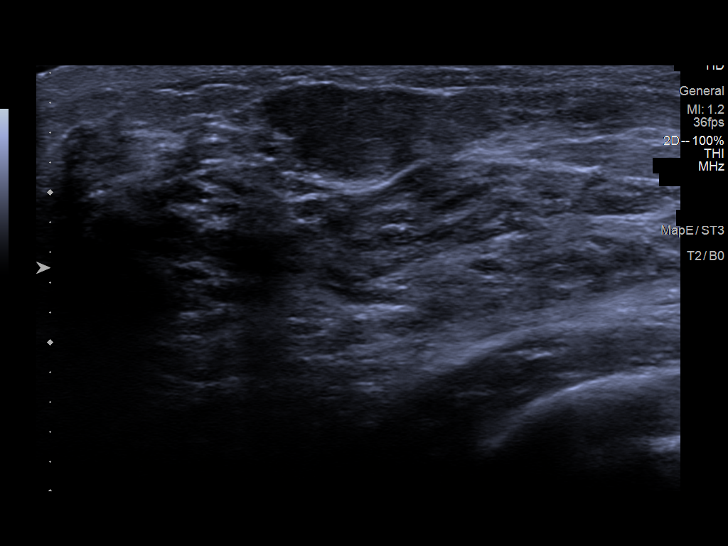
[im 6/14]
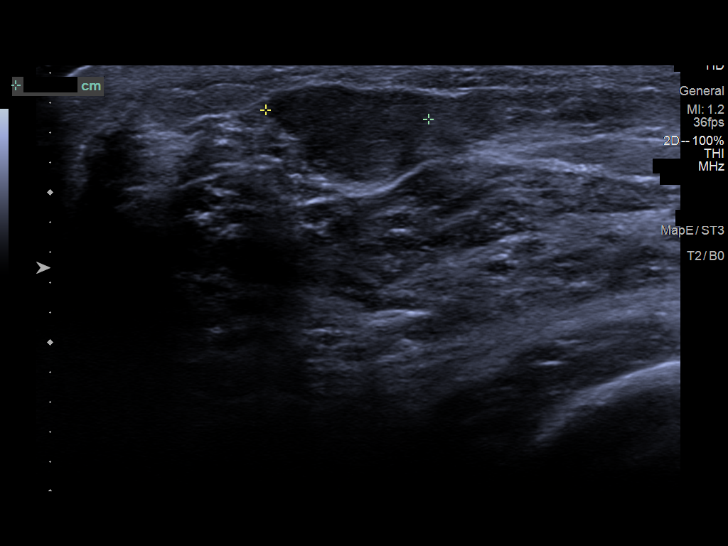
[im 8/14]
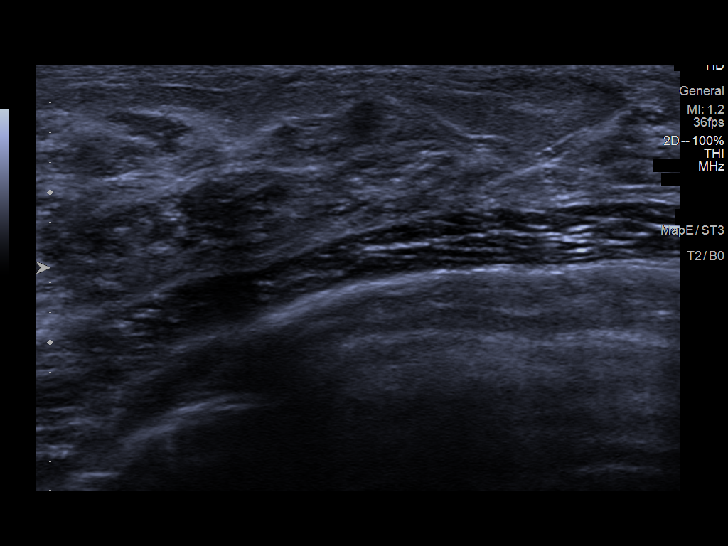
[im 9/14]
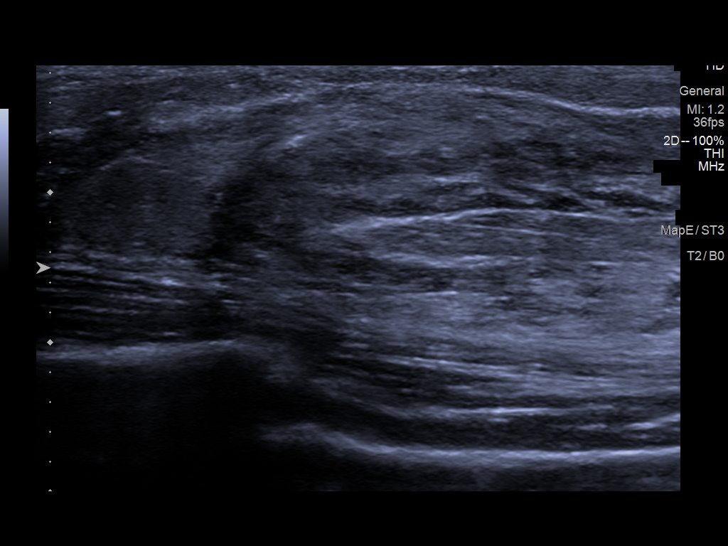
[im 10/14]
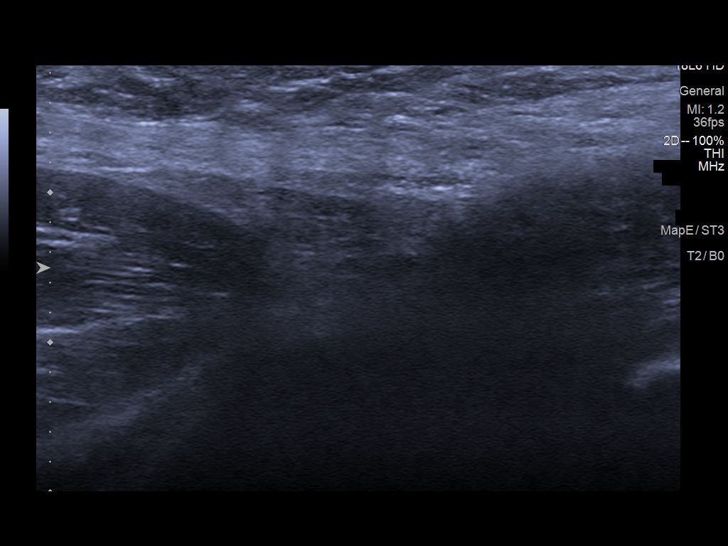
[im 11/14]
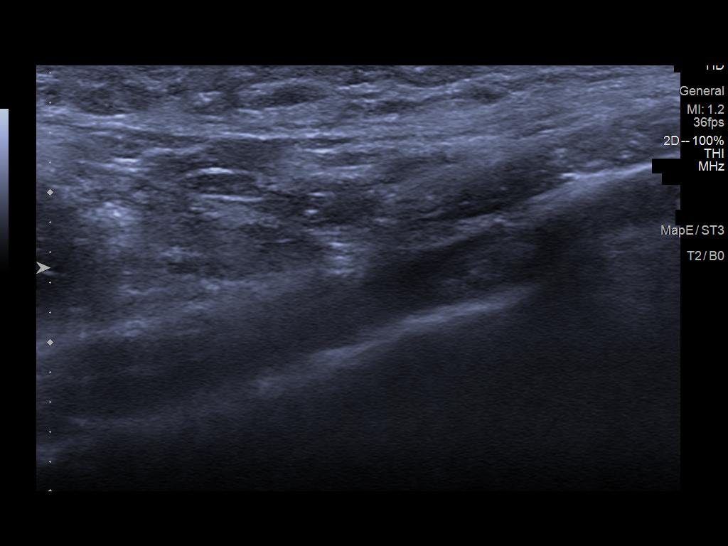
[im 12/14]
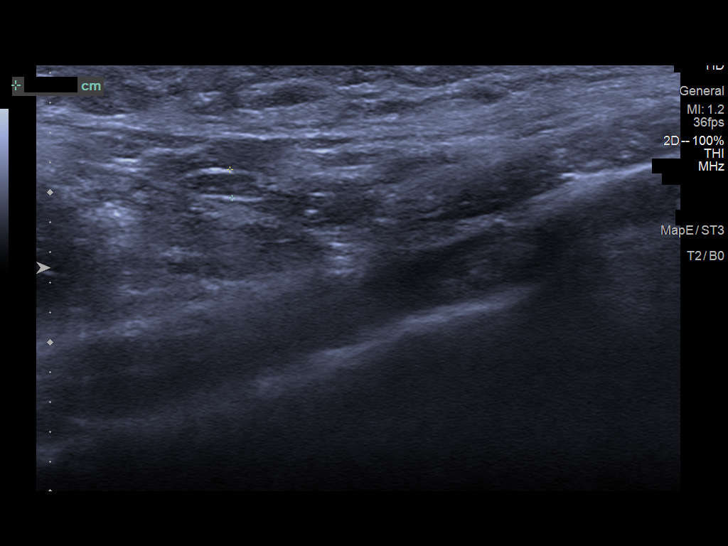
[im 13/14]
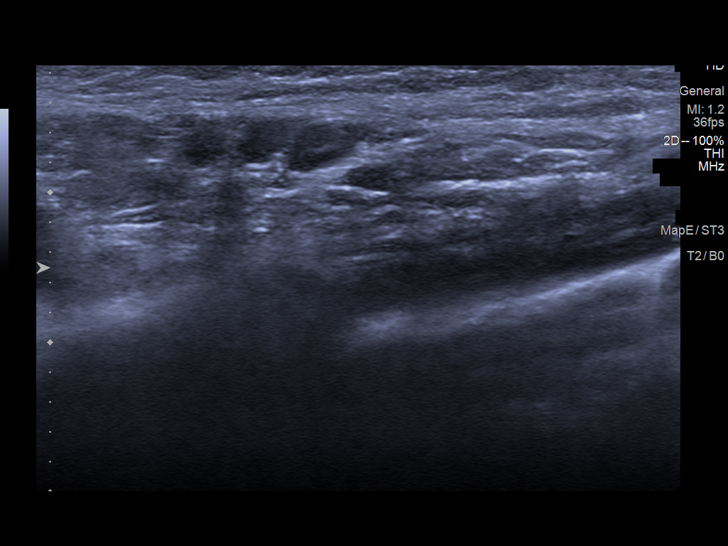
[im 14/14]
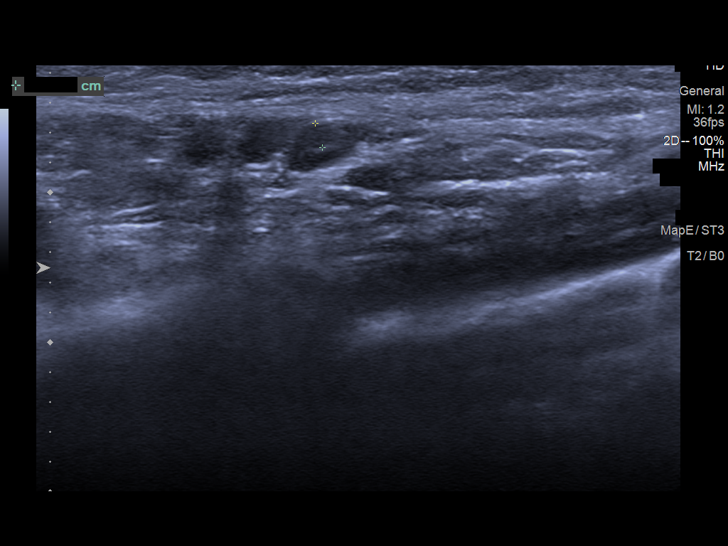

[13 of 14 positions shown; findings below may reference images not displayed]

FINDINGS: On correlative physical exam, there is a freely mobile palpable
approximate 1-2 cm lump in the UPPER OUTER QUADRANT of the RIGHT
breast corresponding to 1 of the palpable lumps. There is a palpable
ridge of tissue in the upper RIGHT breast corresponding to the
second palpable lump, though I do not palpate a discrete mass at
this location. There is a similar palpable ridge of tissue in the
upper-outer LEFT breast though again, I do not palpate a discrete
mass at this location.

RIGHT: Targeted ultrasound is performed, demonstrating a
circumscribed oval parallel superficial hypoechoic mass at the 11
o'clock position 6 cm from the nipple measuring approximately 1.2 x
0.6 x 1.1 cm, demonstrating posterior acoustic enhancement and
demonstrating internal power Doppler flow, accounting for the
palpable concern.

At the 12 o'clock position 6 cm from the nipple in the area of
palpable concern is the posterosuperior margin of the fibroglandular
tissue which accounts for the palpable concern. No cyst, solid mass
or abnormal acoustic shadowing is present in this location.

LEFT: Targeted ultrasound is performed, demonstrating the
posterolateral margin of the fibroglandular tissue which accounts
for the palpable concern. No cyst, solid mass or abnormal acoustic
shadowing is present in this location.
IMPRESSION: 1. Likely benign 1.2 cm mass involving the UPPER OUTER QUADRANT of
the RIGHT breast at 11 o'clock 6 cm from the nipple, likely a
fibroadenoma.
2. Normal LEFT breast ultrasound.

RECOMMENDATION:
RIGHT breast ultrasound in 6 months.

I have discussed the findings and recommendations with the patient.
If applicable, a reminder letter will be sent to the patient
regarding the next appointment.

BI-RADS CATEGORY  3: Probably benign.

## 2022-12-18 IMAGING — US US BREAST*L* LIMITED INC AXILLA
1 series · 4 of 4 positions shown · non-contrast
Comparison: None.

CLINICAL DATA: 20-year-old with possible palpable lumps in both
breasts, 2 on the RIGHT and 1 on the LEFT.

EXAM:
ULTRASOUND OF THE BILATERAL BREASTS

[Series 1: us breast*left* limited inc axilla · 0.07mm/px · 4 of 4 slices shown]
[im 1/4]
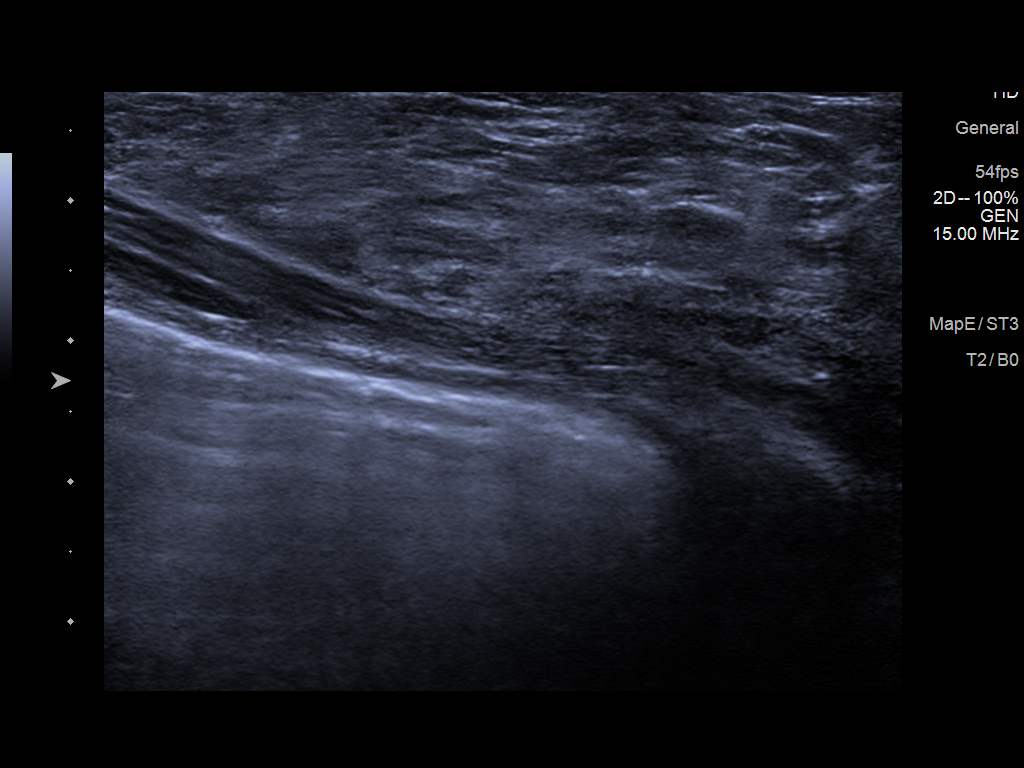
[im 2/4]
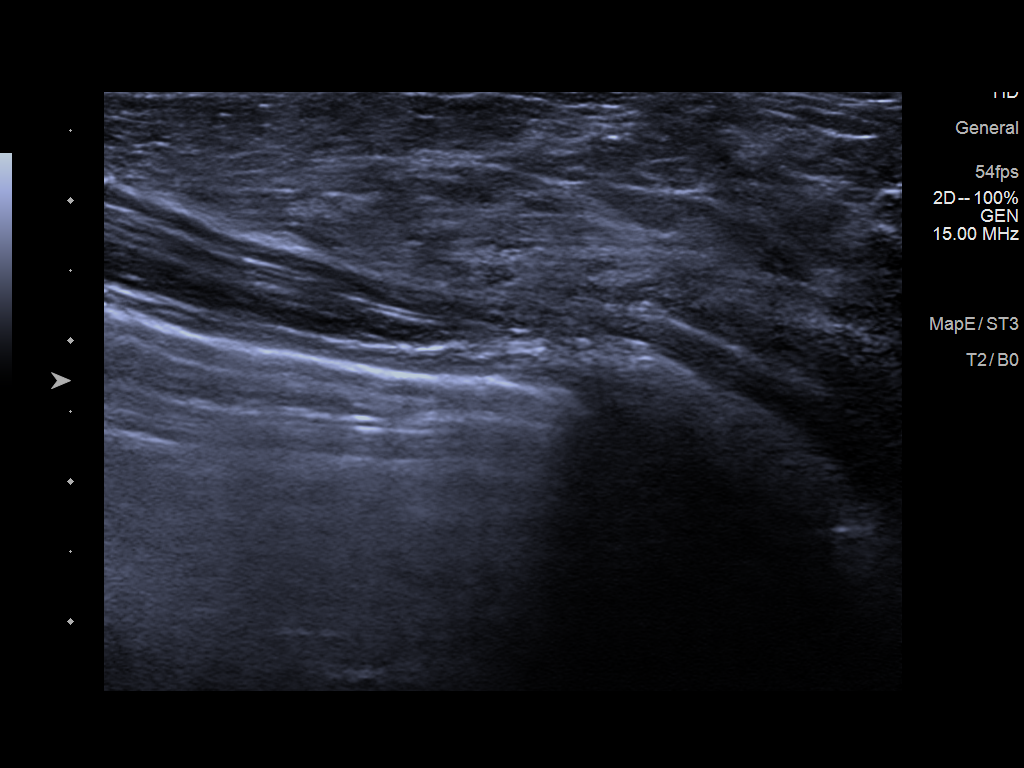
[im 3/4]
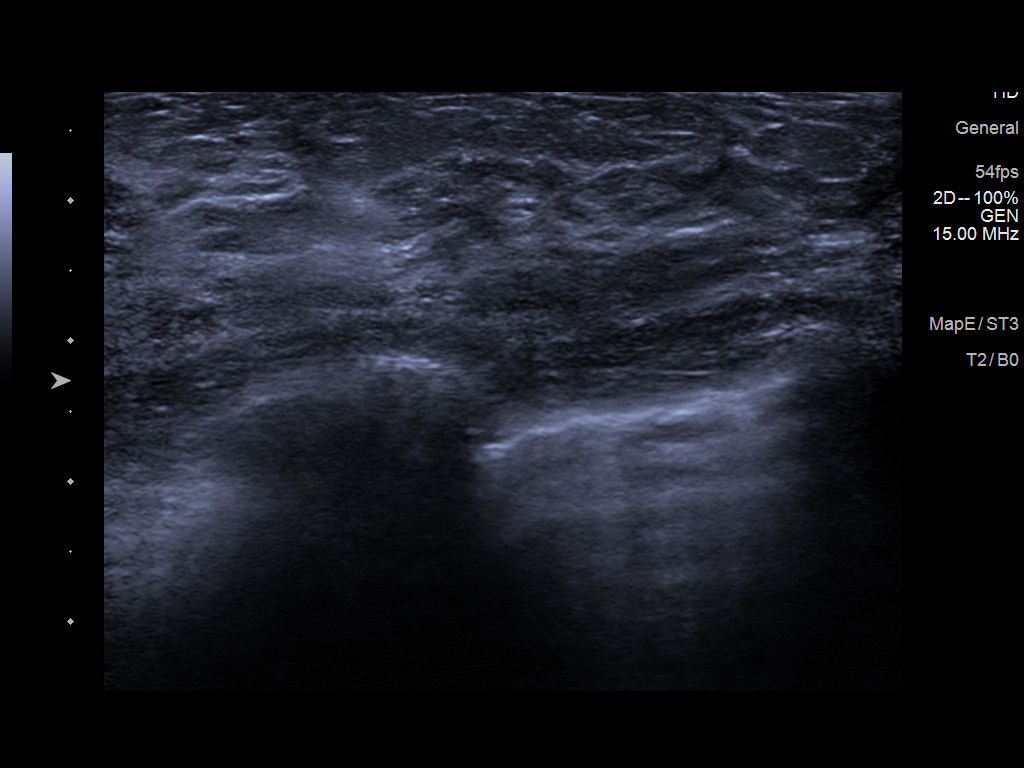
[im 4/4]
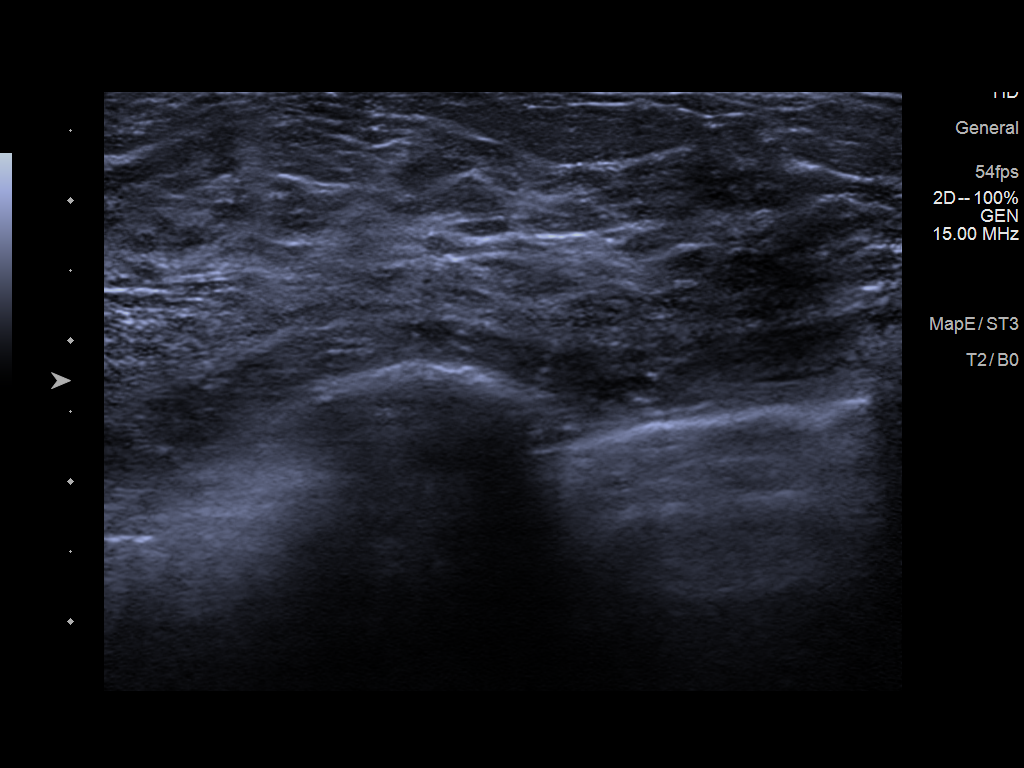

[4 of 4 positions shown; findings below may reference images not displayed]

FINDINGS: On correlative physical exam, there is a freely mobile palpable
approximate 1-2 cm lump in the UPPER OUTER QUADRANT of the RIGHT
breast corresponding to 1 of the palpable lumps. There is a palpable
ridge of tissue in the upper RIGHT breast corresponding to the
second palpable lump, though I do not palpate a discrete mass at
this location. There is a similar palpable ridge of tissue in the
upper-outer LEFT breast though again, I do not palpate a discrete
mass at this location.

RIGHT: Targeted ultrasound is performed, demonstrating a
circumscribed oval parallel superficial hypoechoic mass at the 11
o'clock position 6 cm from the nipple measuring approximately 1.2 x
0.6 x 1.1 cm, demonstrating posterior acoustic enhancement and
demonstrating internal power Doppler flow, accounting for the
palpable concern.

At the 12 o'clock position 6 cm from the nipple in the area of
palpable concern is the posterosuperior margin of the fibroglandular
tissue which accounts for the palpable concern. No cyst, solid mass
or abnormal acoustic shadowing is present in this location.

LEFT: Targeted ultrasound is performed, demonstrating the
posterolateral margin of the fibroglandular tissue which accounts
for the palpable concern. No cyst, solid mass or abnormal acoustic
shadowing is present in this location.
IMPRESSION: 1. Likely benign 1.2 cm mass involving the UPPER OUTER QUADRANT of
the RIGHT breast at 11 o'clock 6 cm from the nipple, likely a
fibroadenoma.
2. Normal LEFT breast ultrasound.

RECOMMENDATION:
RIGHT breast ultrasound in 6 months.

I have discussed the findings and recommendations with the patient.
If applicable, a reminder letter will be sent to the patient
regarding the next appointment.

BI-RADS CATEGORY  3: Probably benign.

## 2022-12-26 DIAGNOSIS — Z0279 Encounter for issue of other medical certificate: Secondary | ICD-10-CM
